# Patient Record
Sex: Male | Born: 1975 | Race: White | Hispanic: No | Marital: Single | Smoking: Current every day smoker
Health system: Southern US, Community
[De-identification: ages and names within clinical notes are randomized; demographics above are authoritative.]

## PROBLEM LIST (undated history)

## (undated) DIAGNOSIS — N189 Chronic kidney disease, unspecified: Secondary | ICD-10-CM

## (undated) DIAGNOSIS — I491 Atrial premature depolarization: Secondary | ICD-10-CM

## (undated) DIAGNOSIS — I499 Cardiac arrhythmia, unspecified: Secondary | ICD-10-CM

## (undated) DIAGNOSIS — M549 Dorsalgia, unspecified: Secondary | ICD-10-CM

## (undated) DIAGNOSIS — B009 Herpesviral infection, unspecified: Secondary | ICD-10-CM

## (undated) DIAGNOSIS — G8929 Other chronic pain: Secondary | ICD-10-CM

## (undated) DIAGNOSIS — R7989 Other specified abnormal findings of blood chemistry: Secondary | ICD-10-CM

## (undated) DIAGNOSIS — I493 Ventricular premature depolarization: Secondary | ICD-10-CM

## (undated) DIAGNOSIS — G4730 Sleep apnea, unspecified: Secondary | ICD-10-CM

## (undated) HISTORY — PX: NASAL SEPTOPLASTY W/ TURBINOPLASTY: SHX2070

---

## 2001-02-06 ENCOUNTER — Emergency Department (HOSPITAL_COMMUNITY): Admission: EM | Admit: 2001-02-06 | Discharge: 2001-02-07 | Payer: Self-pay

## 2002-01-15 ENCOUNTER — Encounter: Payer: Self-pay | Admitting: Family Medicine

## 2002-01-15 ENCOUNTER — Encounter: Admission: RE | Admit: 2002-01-15 | Discharge: 2002-01-15 | Payer: Self-pay | Admitting: Family Medicine

## 2010-07-30 HISTORY — PX: TONSILLECTOMY: SUR1361

## 2011-04-01 ENCOUNTER — Emergency Department: Payer: Self-pay | Admitting: Emergency Medicine

## 2014-11-02 ENCOUNTER — Other Ambulatory Visit (HOSPITAL_COMMUNITY): Payer: Self-pay | Admitting: Urology

## 2014-11-02 DIAGNOSIS — E291 Testicular hypofunction: Secondary | ICD-10-CM

## 2014-11-02 DIAGNOSIS — E230 Hypopituitarism: Secondary | ICD-10-CM

## 2014-11-10 ENCOUNTER — Ambulatory Visit (HOSPITAL_COMMUNITY)
Admission: RE | Admit: 2014-11-10 | Discharge: 2014-11-10 | Disposition: A | Discharge status: Home / Self Care | Payer: PRIVATE HEALTH INSURANCE | Source: Ambulatory Visit | Attending: Urology | Admitting: Urology

## 2014-11-10 DIAGNOSIS — E230 Hypopituitarism: Secondary | ICD-10-CM | POA: Diagnosis present

## 2014-11-10 DIAGNOSIS — E291 Testicular hypofunction: Secondary | ICD-10-CM | POA: Diagnosis not present

## 2014-11-10 LAB — POCT I-STAT CREATININE: Creatinine, Ser: 1.6 mg/dL — ABNORMAL HIGH (ref 0.50–1.35)

## 2014-11-10 MED ORDER — GADOBENATE DIMEGLUMINE 529 MG/ML IV SOLN
10.0000 mL | Freq: Once | INTRAVENOUS | Status: AC | PRN
Start: 2014-11-10 — End: 2014-11-10
  Administered 2014-11-10: 10 mL via INTRAVENOUS

## 2015-03-20 ENCOUNTER — Emergency Department (INDEPENDENT_AMBULATORY_CARE_PROVIDER_SITE_OTHER): Payer: PRIVATE HEALTH INSURANCE

## 2015-03-20 ENCOUNTER — Emergency Department (INDEPENDENT_AMBULATORY_CARE_PROVIDER_SITE_OTHER)
Admission: EM | Admit: 2015-03-20 | Discharge: 2015-03-20 | Disposition: A | Discharge status: Home / Self Care | Payer: PRIVATE HEALTH INSURANCE | Source: Home / Self Care | Attending: Emergency Medicine | Admitting: Emergency Medicine

## 2015-03-20 ENCOUNTER — Encounter (HOSPITAL_COMMUNITY): Payer: Self-pay | Admitting: Emergency Medicine

## 2015-03-20 DIAGNOSIS — S62309A Unspecified fracture of unspecified metacarpal bone, initial encounter for closed fracture: Secondary | ICD-10-CM

## 2015-03-20 MED ORDER — HYDROCODONE-ACETAMINOPHEN 5-325 MG PO TABS: 1.0000 | ORAL_TABLET | Freq: Four times a day (QID) | ORAL | Status: DC | PRN

## 2015-03-20 NOTE — Discharge Instructions (Signed)
You broke your third and fourth metacarpal bones. Take Tylenol as needed for pain. Use the Norco as needed for severe pain. We've the splint in place. Apply ice over the splint. Please call Dr. Merrilee Seashore office tomorrow morning for an appointment.

## 2015-03-20 NOTE — ED Notes (Signed)
C/o finger injury States he was playing softball when he heard pops in his fingers as he was swinging Did tape fingers together

## 2015-03-20 NOTE — ED Provider Notes (Signed)
CSN: [id number]     Arrival date & time 03/20/15  1851 History   First MD Initiated Contact with Patient 03/20/15 1903     Chief Complaint  Patient presents with  . Finger Injury   (Consider location/radiation/quality/duration/timing/severity/associated sxs/prior Treatment) HPI  He is a 39 year old man here for left hand injury. He states he was playing softball this afternoon and swinging the bat when the ball of the bat hit his left lateral hand. He felt a pop. He states it has gradually been getting more swollen. He is unable to make a fist due to pain and swelling. He can fully extend his fingers.  History reviewed. No pertinent past medical history. No past surgical history on file. History reviewed. No pertinent family history. Social History  Substance Use Topics  . Smoking status: None  . Smokeless tobacco: None  . Alcohol Use: None    Review of Systems As in history of present illness Allergies  Zithromax  Home Medications   Prior to Admission medications   Medication Sig Start Date End Date Taking? Authorizing Provider  HYDROcodone-acetaminophen (NORCO) 5-325 MG per tablet Take 1 tablet by mouth every 6 (six) hours as needed for moderate pain. 03/20/15   Charm Rings, MD   BP 158/79 mmHg  Pulse 86  Temp(Src) 98.6 F (37 C) (Oral)  Resp 18  SpO2 97% Physical Exam  Constitutional: He is oriented to person, place, and time. He appears well-developed and well-nourished. No distress.  Cardiovascular: Normal rate.   Pulmonary/Chest: Effort normal.  Musculoskeletal:  Left hand: Mild swelling over ulnar hand. He is tender to palpation over the third and fourth metacarpals. Limited flexion of the third fourth and fifth digits due to pain and swelling. 2+ radial pulse. Brisk cap refill in all digits.  Neurological: He is alert and oriented to person, place, and time.    ED Course  Procedures (including critical care time) Labs Review Labs Reviewed - No data to  display  Imaging Review Dg Hand Complete Left  03/20/2015   CLINICAL DATA:  Injury to the palm of the hand while batting. Pain right fourth metacarpal with soft tissue swelling.  EXAM: LEFT HAND - COMPLETE 3+ VIEW  COMPARISON:  None.  FINDINGS: Oblique fracture of the proximal/ mid shaft of the left fourth metacarpal bone. Mild dorsal displacement of the distal fracture fragment. Nondisplaced oblique fracture of the midshaft left third metacarpal bone. Soft tissue swelling. No focal bone lesions.  IMPRESSION: Acute posttraumatic fractures of the left third and fourth metacarpal bones.   Electronically Signed   By: Burman Nieves M.D.   On: 03/20/2015 19:52     MDM   1. Metacarpal bone fracture, closed, initial encounter    Spoke with Dr. Merlyn Lot, hand specialist, who recommended ulnar gutter splint and follow-up in their office this week. Ulnar gutter splint placed. Prescription for Norco given to use for pain. Follow-up with Dr. Merlyn Lot.    Charm Rings, MD 03/20/15 2003

## 2015-03-23 ENCOUNTER — Other Ambulatory Visit: Payer: Self-pay | Admitting: Orthopedic Surgery

## 2015-03-24 ENCOUNTER — Encounter (HOSPITAL_BASED_OUTPATIENT_CLINIC_OR_DEPARTMENT_OTHER): Payer: Self-pay | Admitting: Certified Registered"

## 2015-03-24 ENCOUNTER — Encounter (HOSPITAL_BASED_OUTPATIENT_CLINIC_OR_DEPARTMENT_OTHER)
Admission: RE | Disposition: A | Discharge status: Home / Self Care | Payer: Self-pay | Source: Ambulatory Visit | Attending: Orthopedic Surgery

## 2015-03-24 ENCOUNTER — Ambulatory Visit (HOSPITAL_BASED_OUTPATIENT_CLINIC_OR_DEPARTMENT_OTHER): Payer: PRIVATE HEALTH INSURANCE | Admitting: Anesthesiology

## 2015-03-24 ENCOUNTER — Ambulatory Visit (HOSPITAL_BASED_OUTPATIENT_CLINIC_OR_DEPARTMENT_OTHER)
Admission: RE | Admit: 2015-03-24 | Discharge: 2015-03-24 | Disposition: A | Discharge status: Home / Self Care | Payer: PRIVATE HEALTH INSURANCE | Source: Ambulatory Visit | Attending: Orthopedic Surgery | Admitting: Orthopedic Surgery

## 2015-03-24 DIAGNOSIS — I491 Atrial premature depolarization: Secondary | ICD-10-CM | POA: Diagnosis not present

## 2015-03-24 DIAGNOSIS — B009 Herpesviral infection, unspecified: Secondary | ICD-10-CM | POA: Insufficient documentation

## 2015-03-24 DIAGNOSIS — X58XXXA Exposure to other specified factors, initial encounter: Secondary | ICD-10-CM | POA: Insufficient documentation

## 2015-03-24 DIAGNOSIS — Y998 Other external cause status: Secondary | ICD-10-CM | POA: Diagnosis not present

## 2015-03-24 DIAGNOSIS — Y9364 Activity, baseball: Secondary | ICD-10-CM | POA: Diagnosis not present

## 2015-03-24 DIAGNOSIS — F17210 Nicotine dependence, cigarettes, uncomplicated: Secondary | ICD-10-CM | POA: Diagnosis not present

## 2015-03-24 DIAGNOSIS — Z881 Allergy status to other antibiotic agents status: Secondary | ICD-10-CM | POA: Insufficient documentation

## 2015-03-24 DIAGNOSIS — I493 Ventricular premature depolarization: Secondary | ICD-10-CM | POA: Insufficient documentation

## 2015-03-24 DIAGNOSIS — S62393A Other fracture of third metacarpal bone, left hand, initial encounter for closed fracture: Secondary | ICD-10-CM | POA: Insufficient documentation

## 2015-03-24 DIAGNOSIS — Y92320 Baseball field as the place of occurrence of the external cause: Secondary | ICD-10-CM | POA: Diagnosis not present

## 2015-03-24 DIAGNOSIS — S62395A Other fracture of fourth metacarpal bone, left hand, initial encounter for closed fracture: Secondary | ICD-10-CM | POA: Insufficient documentation

## 2015-03-24 DIAGNOSIS — M549 Dorsalgia, unspecified: Secondary | ICD-10-CM | POA: Insufficient documentation

## 2015-03-24 DIAGNOSIS — G8929 Other chronic pain: Secondary | ICD-10-CM | POA: Insufficient documentation

## 2015-03-24 HISTORY — DX: Other chronic pain: G89.29

## 2015-03-24 HISTORY — DX: Atrial premature depolarization: I49.1

## 2015-03-24 HISTORY — DX: Herpesviral infection, unspecified: B00.9

## 2015-03-24 HISTORY — PX: OPEN REDUCTION INTERNAL FIXATION (ORIF) METACARPAL: SHX6234

## 2015-03-24 HISTORY — DX: Dorsalgia, unspecified: M54.9

## 2015-03-24 HISTORY — DX: Ventricular premature depolarization: I49.3

## 2015-03-24 HISTORY — DX: Other specified abnormal findings of blood chemistry: R79.89

## 2015-03-24 LAB — POCT HEMOGLOBIN-HEMACUE: Hemoglobin: 16.2 g/dL (ref 13.0–17.0)

## 2015-03-24 SURGERY — Surgical Case
Anesthesia: *Unknown

## 2015-03-24 SURGERY — OPEN REDUCTION INTERNAL FIXATION (ORIF) METACARPAL
Anesthesia: General | Site: Hand | Laterality: Left

## 2015-03-24 MED ORDER — CEFAZOLIN SODIUM-DEXTROSE 2-3 GM-% IV SOLR
2.0000 g | INTRAVENOUS | Status: AC
  Administered 2015-03-24: 2 g via INTRAVENOUS

## 2015-03-24 MED ORDER — SCOPOLAMINE 1 MG/3DAYS TD PT72: 1.0000 | MEDICATED_PATCH | Freq: Once | TRANSDERMAL | Status: DC | PRN

## 2015-03-24 MED ORDER — ONDANSETRON HCL 4 MG/2ML IJ SOLN
INTRAMUSCULAR | Status: DC | PRN
  Administered 2015-03-24: 4 mg via INTRAVENOUS

## 2015-03-24 MED ORDER — FENTANYL CITRATE (PF) 100 MCG/2ML IJ SOLN
INTRAMUSCULAR | Status: AC
  Filled 2015-03-24: qty 6

## 2015-03-24 MED ORDER — PROPOFOL 10 MG/ML IV BOLUS
INTRAVENOUS | Status: DC | PRN
  Administered 2015-03-24: 200 mg via INTRAVENOUS

## 2015-03-24 MED ORDER — ONDANSETRON HCL 4 MG/2ML IJ SOLN: 4.0000 mg | Freq: Once | INTRAMUSCULAR | Status: DC | PRN

## 2015-03-24 MED ORDER — OXYCODONE HCL 5 MG PO TABS
ORAL_TABLET | ORAL | Status: AC
  Filled 2015-03-24: qty 1

## 2015-03-24 MED ORDER — 0.9 % SODIUM CHLORIDE (POUR BTL) OPTIME
TOPICAL | Status: DC | PRN
  Administered 2015-03-24: 250 mL

## 2015-03-24 MED ORDER — CHLORHEXIDINE GLUCONATE 4 % EX LIQD: 60.0000 mL | Freq: Once | CUTANEOUS | Status: DC

## 2015-03-24 MED ORDER — MIDAZOLAM HCL 2 MG/2ML IJ SOLN
1.0000 mg | INTRAMUSCULAR | Status: DC | PRN
  Administered 2015-03-24: 2 mg via INTRAVENOUS

## 2015-03-24 MED ORDER — BUPIVACAINE HCL (PF) 0.25 % IJ SOLN
INTRAMUSCULAR | Status: AC
  Filled 2015-03-24: qty 30

## 2015-03-24 MED ORDER — FENTANYL CITRATE (PF) 100 MCG/2ML IJ SOLN
INTRAMUSCULAR | Status: AC
  Filled 2015-03-24: qty 2

## 2015-03-24 MED ORDER — OXYCODONE-ACETAMINOPHEN 5-325 MG PO TABS: ORAL_TABLET | ORAL | Status: DC

## 2015-03-24 MED ORDER — GLYCOPYRROLATE 0.2 MG/ML IJ SOLN: 0.2000 mg | Freq: Once | INTRAMUSCULAR | Status: DC | PRN

## 2015-03-24 MED ORDER — OXYCODONE HCL 5 MG PO TABS
5.0000 mg | ORAL_TABLET | Freq: Once | ORAL | Status: AC
Start: 2015-03-24 — End: 2015-03-24
  Administered 2015-03-24: 5 mg via ORAL

## 2015-03-24 MED ORDER — BUPIVACAINE HCL (PF) 0.25 % IJ SOLN
INTRAMUSCULAR | Status: DC | PRN
  Administered 2015-03-24: 10 mL

## 2015-03-24 MED ORDER — DEXAMETHASONE SODIUM PHOSPHATE 10 MG/ML IJ SOLN
INTRAMUSCULAR | Status: DC | PRN
  Administered 2015-03-24: 10 mg via INTRAVENOUS

## 2015-03-24 MED ORDER — FENTANYL CITRATE (PF) 100 MCG/2ML IJ SOLN
25.0000 ug | INTRAMUSCULAR | Status: DC | PRN
  Administered 2015-03-24 (×2): 50 ug via INTRAVENOUS

## 2015-03-24 MED ORDER — FENTANYL CITRATE (PF) 100 MCG/2ML IJ SOLN
50.0000 ug | INTRAMUSCULAR | Status: AC | PRN
  Administered 2015-03-24: 50 ug via INTRAVENOUS
  Administered 2015-03-24: 25 ug via INTRAVENOUS
  Administered 2015-03-24: 50 ug via INTRAVENOUS

## 2015-03-24 MED ORDER — CEFAZOLIN SODIUM-DEXTROSE 2-3 GM-% IV SOLR
INTRAVENOUS | Status: AC
  Filled 2015-03-24: qty 50

## 2015-03-24 MED ORDER — MIDAZOLAM HCL 2 MG/2ML IJ SOLN
INTRAMUSCULAR | Status: AC
  Filled 2015-03-24: qty 2

## 2015-03-24 MED ORDER — LIDOCAINE HCL (CARDIAC) 20 MG/ML IV SOLN
INTRAVENOUS | Status: DC | PRN
  Administered 2015-03-24: 60 mg via INTRAVENOUS

## 2015-03-24 MED ORDER — LACTATED RINGERS IV SOLN
INTRAVENOUS | Status: DC
  Administered 2015-03-24 (×2): via INTRAVENOUS

## 2015-03-24 SURGICAL SUPPLY — 63 items
BANDAGE ELASTIC 3 VELCRO ST LF (GAUZE/BANDAGES/DRESSINGS) IMPLANT
BIT DRILL 1.1 (BIT) ×1
BIT DRILL 60X20X1.1XQC TMX (BIT) ×1 IMPLANT
BIT DRL 60X20X1.1XQC TMX (BIT) ×1
BLADE MINI RND TIP GREEN BEAV (BLADE) IMPLANT
BLADE SURG 15 STRL LF DISP TIS (BLADE) ×2 IMPLANT
BLADE SURG 15 STRL SS (BLADE) ×2
BNDG ESMARK 4X9 LF (GAUZE/BANDAGES/DRESSINGS) ×2 IMPLANT
BNDG GAUZE ELAST 4 BULKY (GAUZE/BANDAGES/DRESSINGS) ×2 IMPLANT
CHLORAPREP W/TINT 26ML (MISCELLANEOUS) ×2 IMPLANT
CORDS BIPOLAR (ELECTRODE) ×2 IMPLANT
COVER BACK TABLE 60X90IN (DRAPES) ×2 IMPLANT
COVER MAYO STAND STRL (DRAPES) ×2 IMPLANT
CUFF TOURNIQUET SINGLE 18IN (TOURNIQUET CUFF) ×2 IMPLANT
DRAPE EXTREMITY T 121X128X90 (DRAPE) ×2 IMPLANT
DRAPE OEC MINIVIEW 54X84 (DRAPES) ×2 IMPLANT
DRAPE SURG 17X23 STRL (DRAPES) ×2 IMPLANT
GAUZE SPONGE 4X4 12PLY STRL (GAUZE/BANDAGES/DRESSINGS) ×2 IMPLANT
GAUZE XEROFORM 1X8 LF (GAUZE/BANDAGES/DRESSINGS) ×2 IMPLANT
GLOVE BIO SURGEON STRL SZ7.5 (GLOVE) ×2 IMPLANT
GLOVE BIOGEL M STRL SZ7.5 (GLOVE) ×2 IMPLANT
GLOVE BIOGEL PI IND STRL 7.0 (GLOVE) ×1 IMPLANT
GLOVE BIOGEL PI IND STRL 8 (GLOVE) ×2 IMPLANT
GLOVE BIOGEL PI INDICATOR 7.0 (GLOVE) ×1
GLOVE BIOGEL PI INDICATOR 8 (GLOVE) ×2
GLOVE ECLIPSE 6.5 STRL STRAW (GLOVE) ×2 IMPLANT
GLOVE EXAM NITRILE LRG STRL (GLOVE) ×2 IMPLANT
GLOVE EXAM NITRILE MD LF STRL (GLOVE) ×2 IMPLANT
GOWN STRL REUS W/ TWL LRG LVL3 (GOWN DISPOSABLE) ×1 IMPLANT
GOWN STRL REUS W/ TWL XL LVL3 (GOWN DISPOSABLE) ×2 IMPLANT
GOWN STRL REUS W/TWL LRG LVL3 (GOWN DISPOSABLE) ×1
GOWN STRL REUS W/TWL XL LVL3 (GOWN DISPOSABLE) ×2
NEEDLE HYPO 22GX1.5 SAFETY (NEEDLE) IMPLANT
NEEDLE HYPO 25X1 1.5 SAFETY (NEEDLE) ×2 IMPLANT
NS IRRIG 1000ML POUR BTL (IV SOLUTION) ×2 IMPLANT
PACK BASIN DAY SURGERY FS (CUSTOM PROCEDURE TRAY) ×2 IMPLANT
PAD CAST 3X4 CTTN HI CHSV (CAST SUPPLIES) IMPLANT
PAD CAST 4YDX4 CTTN HI CHSV (CAST SUPPLIES) ×1 IMPLANT
PADDING CAST ABS 4INX4YD NS (CAST SUPPLIES) ×1
PADDING CAST ABS COTTON 4X4 ST (CAST SUPPLIES) ×1 IMPLANT
PADDING CAST COTTON 3X4 STRL (CAST SUPPLIES)
PADDING CAST COTTON 4X4 STRL (CAST SUPPLIES) ×1
SCREW NL 1.5X11 WRIST (Screw) ×2 IMPLANT
SCREW NL 1.5X12 (Screw) ×4 IMPLANT
SCREW NONIOC 1.5 10M (Screw) ×2 IMPLANT
SCREW NONIOC 1.5 14M (Screw) ×6 IMPLANT
SLEEVE SCD COMPRESS KNEE MED (MISCELLANEOUS) ×2 IMPLANT
SPLINT PLASTER CAST XFAST 3X15 (CAST SUPPLIES) IMPLANT
SPLINT PLASTER CAST XFAST 4X15 (CAST SUPPLIES) ×20 IMPLANT
SPLINT PLASTER XTRA FAST SET 4 (CAST SUPPLIES) ×20
SPLINT PLASTER XTRA FASTSET 3X (CAST SUPPLIES)
STOCKINETTE 4X48 STRL (DRAPES) ×2 IMPLANT
SUT CHROMIC 5 0 P 3 (SUTURE) ×2 IMPLANT
SUT ETHILON 3 0 PS 1 (SUTURE) IMPLANT
SUT ETHILON 4 0 PS 2 18 (SUTURE) ×2 IMPLANT
SUT MERSILENE 4 0 P 3 (SUTURE) IMPLANT
SUT VIC AB 3-0 PS1 18 (SUTURE)
SUT VIC AB 3-0 PS1 18XBRD (SUTURE) IMPLANT
SUT VICRYL 4-0 PS2 18IN ABS (SUTURE) ×2 IMPLANT
SYR BULB 3OZ (MISCELLANEOUS) ×2 IMPLANT
SYR CONTROL 10ML LL (SYRINGE) ×2 IMPLANT
TOWEL OR 17X24 6PK STRL BLUE (TOWEL DISPOSABLE) ×4 IMPLANT
UNDERPAD 30X30 (UNDERPADS AND DIAPERS) ×2 IMPLANT

## 2015-03-24 NOTE — Transfer of Care (Signed)
Immediate Anesthesia Transfer of Care Note  Patient: Steven Bauer  Procedure(s) Performed: Procedure(s): OPEN REDUCTION INTERNAL FIXATION (ORIF) LONG AND RING METACARPAL FRACTURE (Left)  Patient Location: PACU  Anesthesia Type:General  Level of Consciousness: awake and patient cooperative  Airway & Oxygen Therapy: Patient Spontanous Breathing and Patient connected to face mask oxygen  Post-op Assessment: Report given to RN and Post -op Vital signs reviewed and stable  Post vital signs: Reviewed and stable  Last Vitals:  Filed Vitals:   03/24/15 0759  BP: 157/52  Pulse: 70  Temp: 36.6 C  Resp: 18    Complications: No apparent anesthesia complications

## 2015-03-24 NOTE — Discharge Instructions (Addendum)

## 2015-03-24 NOTE — Op Note (Signed)
[id number] 

## 2015-03-24 NOTE — Brief Op Note (Signed)
03/24/2015  11:12 AM  PATIENT:  Lajean Manes  39 y.o. male  PRE-OPERATIVE DIAGNOSIS:  Fracture left long/ring finger metacarpals  POST-OPERATIVE DIAGNOSIS:  Fracture left long/ring finger metacarpals  PROCEDURE:  Procedure(s): OPEN REDUCTION INTERNAL FIXATION (ORIF) LONG AND RING METACARPAL FRACTURE (Left)  SURGEON:  Surgeon(s) and Role:    * Betha Loa, MD - Primary  PHYSICIAN ASSISTANT:   ASSISTANTS: Annye Rusk, PA   ANESTHESIA:   general  EBL:  Total I/O In: 1300 [I.V.:1300] Out: -   BLOOD ADMINISTERED:none  DRAINS: none   LOCAL MEDICATIONS USED:  MARCAINE     SPECIMEN:  No Specimen  DISPOSITION OF SPECIMEN:  N/A  COUNTS:  YES  TOURNIQUET:   Total Tourniquet Time Documented: Upper Arm (Left) - 68 minutes Total: Upper Arm (Left) - 68 minutes   DICTATION: .Other Dictation: Dictation Number [id number]  PLAN OF CARE: Discharge to home after PACU  PATIENT DISPOSITION:  PACU - hemodynamically stable.

## 2015-03-24 NOTE — H&P (Signed)
  Steven Bauer is an 39 y.o. male.   Chief Complaint: left long and ring finger metacarpal fractures HPI: 39 yo rhd male injured left hand playing softball 03/20/15.  Seen at [facility] where XR revealed ring and long finger metacarpal fractures.  Splinted and followed up in office.  Reports no previous injury to hand and no other injury at this time.  Past Medical History  Diagnosis Date  . PVC (premature ventricular contraction)   . PAC (premature atrial contraction)   . HSV (herpes simplex virus) infection   . Chronic back pain   . Low testosterone     Past Surgical History  Procedure Laterality Date  . Tonsillectomy  2012    History reviewed. No pertinent family history. Social History:  reports that he has been smoking Cigarettes.  He has been smoking about 0.50 packs per day. He does not have any smokeless tobacco history on file. He reports that he does not drink alcohol or use illicit drugs.  Allergies:  Allergies  Allergen Reactions  . Zithromax [Azithromycin] Rash    Medications Prior to Admission  Medication Sig Dispense Refill  . CLOMIPHENE CITRATE PO Take by mouth.    Marland Kitchen HYDROcodone-acetaminophen (NORCO) 5-325 MG per tablet Take 1 tablet by mouth every 6 (six) hours as needed for moderate pain. 15 tablet 0  . traMADol (ULTRAM) 50 MG tablet Take by mouth every 6 (six) hours as needed.    . valACYclovir (VALTREX) 1000 MG tablet Take 1,000 mg by mouth 2 (two) times daily.    . cyclobenzaprine (FLEXERIL) 10 MG tablet Take 10 mg by mouth 3 (three) times daily as needed for muscle spasms.      Results for orders placed or performed during the hospital encounter of 03/24/15 (from the past 48 hour(s))  Hemoglobin-hemacue, POC     Status: None   Collection Time: 03/24/15  8:19 AM  Result Value Ref Range   Hemoglobin 16.2 13.0 - 17.0 g/dL    No results found.   A comprehensive review of systems was negative except for: Eyes: positive for contacts/glasses Ears, nose, mouth,  throat, and face: positive for hearing loss Hematologic/lymphatic: positive for easy bruising Neurological: positive for gait problems Behavioral/Psych: positive for sleep disturbance  Blood pressure 157/52, pulse 70, temperature 97.9 F (36.6 C), temperature source Oral, resp. rate 18, height 6' (1.829 m), weight 92.194 kg (203 lb 4 oz), SpO2 100 %.  General appearance: alert, cooperative and appears stated age Head: Normocephalic, without obvious abnormality, atraumatic Neck: supple, symmetrical, trachea midline Resp: clear to auscultation bilaterally Cardio: regular rate and rhythm GI: non tender Extremities: intact sensation and capillary refill all digits.  +epl/fpl/io.  no wounds. Pulses: 2+ and symmetric Skin: Skin color, texture, turgor normal. No rashes or lesions Neurologic: Grossly normal Incision/Wound: none  Assessment/Plan Left long and ring finger metacarpal fractures.  Non operative and operative treatment options were discussed with the patient and patient wishes to proceed with operative treatment. Risks, benefits, and alternatives of surgery were discussed and the patient agrees with the plan of care.   Celesta Funderburk R 03/24/2015, 9:38 AM

## 2015-03-24 NOTE — Anesthesia Postprocedure Evaluation (Signed)
  Anesthesia Post-op Note  Patient: Steven Bauer  Procedure(s) Performed: Procedure(s) (LRB): OPEN REDUCTION INTERNAL FIXATION (ORIF) LONG AND RING METACARPAL FRACTURE (Left)  Patient Location: PACU  Anesthesia Type: General  Level of Consciousness: awake and alert   Airway and Oxygen Therapy: Patient Spontanous Breathing  Post-op Pain: mild  Post-op Assessment: Post-op Vital signs reviewed, Patient's Cardiovascular Status Stable, Respiratory Function Stable, Patent Airway and No signs of Nausea or vomiting  Last Vitals:  Filed Vitals:   03/24/15 1225  BP: 140/80  Pulse: 72  Temp: 36.6 C  Resp: 20    Post-op Vital Signs: stable   Complications: No apparent anesthesia complications

## 2015-03-24 NOTE — Anesthesia Preprocedure Evaluation (Addendum)
Anesthesia Evaluation  Patient identified by MRN, date of birth, ID band Patient awake    Reviewed: Allergy & Precautions, NPO status , Patient's Chart, lab work & pertinent test results  History of Anesthesia Complications Negative for: history of anesthetic complications  Airway Mallampati: II  TM Distance: >3 FB Neck ROM: Full    Dental no notable dental hx. (+) Dental Advisory Given   Pulmonary neg pulmonary ROS, Current Smoker,  breath sounds clear to auscultation  Pulmonary exam normal       Cardiovascular negative cardio ROS Normal cardiovascular examRhythm:Regular Rate:Normal     Neuro/Psych negative neurological ROS  negative psych ROS   GI/Hepatic negative GI ROS, Neg liver ROS,   Endo/Other  negative endocrine ROS  Renal/GU negative Renal ROS  negative genitourinary   Musculoskeletal negative musculoskeletal ROS (+)   Abdominal   Peds negative pediatric ROS (+)  Hematology negative hematology ROS (+)   Anesthesia Other Findings   Reproductive/Obstetrics negative OB ROS                            Anesthesia Physical Anesthesia Plan  ASA: I  Anesthesia Plan: General   Post-op Pain Management:    Induction: Intravenous  Airway Management Planned: LMA  Additional Equipment:   Intra-op Plan:   Post-operative Plan: Extubation in OR  Informed Consent: I have reviewed the patients History and Physical, chart, labs and discussed the procedure including the risks, benefits and alternatives for the proposed anesthesia with the patient or authorized representative who has indicated his/her understanding and acceptance.   Dental advisory given  Plan Discussed with: CRNA  Anesthesia Plan Comments:         Anesthesia Quick Evaluation

## 2015-03-24 NOTE — Op Note (Signed)
Intra-operative fluoroscopic images in the AP, lateral, and oblique views were taken and evaluated by myself.  Reduction and hardware placement were confirmed.  There was no intraarticular penetration of permanent hardware.  

## 2015-03-24 NOTE — Anesthesia Procedure Notes (Signed)
Procedure Name: LMA Insertion Date/Time: 03/24/2015 9:52 AM Performed by: Akash Winski D Pre-anesthesia Checklist: Patient identified, Emergency Drugs available, Suction available and Patient being monitored Patient Re-evaluated:Patient Re-evaluated prior to inductionOxygen Delivery Method: Circle System Utilized Preoxygenation: Pre-oxygenation with 100% oxygen Intubation Type: IV induction Ventilation: Mask ventilation without difficulty LMA: LMA inserted LMA Size: 4.0 Number of attempts: 1 Airway Equipment and Method: Bite block Placement Confirmation: positive ETCO2 Tube secured with: Tape Dental Injury: Teeth and Oropharynx as per pre-operative assessment

## 2015-03-25 ENCOUNTER — Encounter (HOSPITAL_BASED_OUTPATIENT_CLINIC_OR_DEPARTMENT_OTHER): Payer: Self-pay | Admitting: Orthopedic Surgery

## 2015-03-25 NOTE — Op Note (Signed)
NAMETAKEO, HARTS NO.:  [id number]  MEDICAL RECORD NO.:  [id number]  LOCATION:                                 FACILITY:  PHYSICIAN:  Betha Loa, MD        DATE OF BIRTH:  Mar 12, 1976  DATE OF PROCEDURE:  03/24/2015 DATE OF DISCHARGE:                              OPERATIVE REPORT   PREOPERATIVE DIAGNOSIS:  Left long and ring finger metacarpal fractures.  POSTOPERATIVE DIAGNOSIS:  Left long and ring finger metacarpal fractures.  PROCEDURE:   1. Open reduction and internal fixation left long finger metacarpal fracture. 2. Open reduction and internal fixation left ring finger metacarpal fracture.  SURGEON:  Betha Loa, MD  ASSISTANT:  Marveen Reeks Dasnoit, PA.  ANESTHESIA:  General.  IV FLUIDS:  Per anesthesia flow sheet.  ESTIMATED BLOOD LOSS:  Minimal.  COMPLICATIONS:  None.  SPECIMENS:  None.  TOURNIQUET TIME:  68 minutes.  DISPOSITION:  Stable to PACU.  INDICATIONS:  Steven Bauer is a 39 year old, right-hand dominant male who approximately 5 days ago injured his left hand while playing softball. He was taken to an urgent care where radiographs were taken revealing long and ring finger metacarpal fractures.  He was referred to me for further care after splinting.  We discussed nonoperative and operative treatment options.  He wished to proceed with operative care.  Risks, benefits, and alternatives of surgery were discussed including risk of blood loss; infection; damage to nerves, vessels, tendons, ligaments, bone; failure of surgery; need for additional surgery; complications with wound healing, continued pain, nonunion, malunion, stiffness.  He voiced understanding of these risks and elected to proceed.  OPERATIVE COURSE:  After being identified preoperatively by myself, the patient and I agreed upon procedure and site of procedure.  Surgical site was marked.  The risks, benefits, and alternatives of surgery were reviewed and he wished to  proceed.  Surgical consent had been signed. He was given IV Ancef as preoperative antibiotic prophylaxis.  He was transported to the operating room and placed on the operating room table in a supine position with the left upper extremity on arm board. General anesthesia was induced by the anesthesiologist.  The left upper extremity was prepped and draped in normal sterile orthopedic fashion. Surgical pause was performed between surgeons, anesthesia, and operating room staff, and all were in agreement as to the patient, procedure, and site of procedure.  Tourniquet at the proximal aspect of the extremity was inflated to 250 mmHg after exsanguination of the limb with an Esmarch bandage.  An incision was made at the dorsum of the hand between the long and ring finger metacarpals, securing subcutaneous tissues by spreading technique.  Bipolar electrocautery was used to obtain hemostasis.  The fascia was incised.  There was accessory extensor muscle going to the long finger.  The juncture between the long and ring finger extensor tendons was incised and noted for repair later.  The periosteum over the long finger was incised and elevated with periosteal elevator.  The fracture was identified towards the distal end of the metacarpal.  It was reduced under direct visualization.  Standard AO drilling and measuring technique was used.  The ALPS set was used.  1.5- mm screws were selected.  Three lag screws were placed across the fracture site to provide stabilization of the fracture.  C-arm was used in AP and lateral projections for appropriate reduction and position of hardware, which was the case.  Attention was turned to the ring finger metacarpal.  An incision was made over the ring finger metacarpal through the periosteum.  This periosteum was elevated with a Therapist, nutritional.  The fracture site was identified.  It was cleared of clot formation.  It was reduced under direct visualization and  provisionally held with a clamp.  Again standard AO drilling and measuring technique was used to place four 1.5-mm screws from the ALPS set across the fracture site.  This stabilized the fracture.  C-arm was used in AP, lateral, and oblique projections to ensure appropriate reduction and position of the hardware, which was the case.  The wounds were copiously irrigated with sterile saline.  The periosteum was closed over top of the metacarpals with 5-0 chromic suture.  The release juncture was repaired with a 4-0 Mersilene in a figure-of-eight fashion.  The subcutaneous tissue was closed with inverted interrupted Vicryl sutures. Skin was closed with 4-0 nylon in a horizontal mattress fashion.  The area was injected with 10 mL of 0.25% plain Marcaine to aid in postoperative analgesia.  It was then dressed with sterile Xeroform, 4 x 4s, and wrapped with Kerlix.  A volar and dorsal slab splint including the index, long, ring, and small fingers was placed with the MPs flexed and IPs extended.  This was wrapped with Kerlix and Ace bandage. Tourniquet was deflated at 68 minutes.  Fingertips were pink with brisk capillary refill after deflation of the tourniquet.  The operative drapes were broken down and the patient was awoken from anesthesia safely.  He was transferred back to the stretcher and taken to PACU in a stable condition.  I will see him back in the office in 1 week for postoperative followup.  I will give him Percocet 5/325, one to two p.o. q.6 hours p.r.n. pain, dispensed #40.     Betha Loa, MD     KK/MEDQ  D:  03/24/2015  T:  03/25/2015  Job:  [id number]

## 2015-06-10 ENCOUNTER — Telehealth: Payer: Self-pay | Admitting: Family Medicine

## 2015-06-10 NOTE — Telephone Encounter (Signed)
pts roommate, Steven Bauer is a patient of yours and has recommended you to him. Can you take him on as a new patient? Please advise

## 2015-06-11 NOTE — Telephone Encounter (Signed)
yes

## 2015-06-13 NOTE — Telephone Encounter (Signed)
lmovm to call back to schedule an appt °

## 2015-06-21 ENCOUNTER — Telehealth: Payer: Self-pay | Admitting: Internal Medicine

## 2015-06-21 ENCOUNTER — Ambulatory Visit (INDEPENDENT_AMBULATORY_CARE_PROVIDER_SITE_OTHER): Payer: PRIVATE HEALTH INSURANCE | Admitting: Internal Medicine

## 2015-06-21 ENCOUNTER — Other Ambulatory Visit (INDEPENDENT_AMBULATORY_CARE_PROVIDER_SITE_OTHER): Payer: PRIVATE HEALTH INSURANCE

## 2015-06-21 ENCOUNTER — Encounter: Payer: Self-pay | Admitting: Internal Medicine

## 2015-06-21 VITALS — BP 118/64 | HR 77 | Temp 97.8°F | Resp 16 | Ht 72.0 in | Wt 212.0 lb

## 2015-06-21 DIAGNOSIS — A6001 Herpesviral infection of penis: Secondary | ICD-10-CM | POA: Diagnosis not present

## 2015-06-21 DIAGNOSIS — F418 Other specified anxiety disorders: Secondary | ICD-10-CM

## 2015-06-21 DIAGNOSIS — N183 Chronic kidney disease, stage 3 unspecified: Secondary | ICD-10-CM

## 2015-06-21 DIAGNOSIS — M545 Low back pain, unspecified: Secondary | ICD-10-CM | POA: Insufficient documentation

## 2015-06-21 DIAGNOSIS — Z299 Encounter for prophylactic measures, unspecified: Secondary | ICD-10-CM | POA: Insufficient documentation

## 2015-06-21 DIAGNOSIS — Z7251 High risk heterosexual behavior: Secondary | ICD-10-CM

## 2015-06-21 DIAGNOSIS — Z0000 Encounter for general adult medical examination without abnormal findings: Secondary | ICD-10-CM

## 2015-06-21 DIAGNOSIS — M549 Dorsalgia, unspecified: Secondary | ICD-10-CM | POA: Diagnosis not present

## 2015-06-21 LAB — COMPREHENSIVE METABOLIC PANEL
ALT: 22 U/L (ref 0–53)
AST: 24 U/L (ref 0–37)
Albumin: 4.3 g/dL (ref 3.5–5.2)
Alkaline Phosphatase: 73 U/L (ref 39–117)
BUN: 19 mg/dL (ref 6–23)
CHLORIDE: 101 meq/L (ref 96–112)
CO2: 30 mEq/L (ref 19–32)
CREATININE: 1.53 mg/dL — AB (ref 0.40–1.50)
Calcium: 9.6 mg/dL (ref 8.4–10.5)
GFR: 54.47 mL/min — ABNORMAL LOW (ref >60.00)
GLUCOSE: 91 mg/dL (ref 70–99)
POTASSIUM: 4.3 meq/L (ref 3.5–5.1)
SODIUM: 137 meq/L (ref 135–145)
Total Bilirubin: 0.5 mg/dL (ref 0.2–1.2)
Total Protein: 7.1 g/dL (ref 6.0–8.3)

## 2015-06-21 LAB — URINALYSIS, ROUTINE W REFLEX MICROSCOPIC
Bilirubin Urine: NEGATIVE
HGB URINE DIPSTICK: NEGATIVE
Ketones, ur: NEGATIVE
Leukocytes, UA: NEGATIVE
NITRITE: NEGATIVE
PH: 7 (ref 5.0–8.0)
RBC / HPF: NONE SEEN (ref 0–?)
SPECIFIC GRAVITY, URINE: 1.02 (ref 1.000–1.030)
TOTAL PROTEIN, URINE-UPE24: NEGATIVE
Urine Glucose: NEGATIVE
Urobilinogen, UA: 0.2 (ref 0.0–1.0)

## 2015-06-21 LAB — LIPID PANEL
CHOLESTEROL: 177 mg/dL (ref 0–200)
HDL: 33.3 mg/dL — ABNORMAL LOW (ref >39.00)
LDL CALC: 107 mg/dL — AB (ref 0–99)
NONHDL: 143.82
Total CHOL/HDL Ratio: 5
Triglycerides: 185 mg/dL — ABNORMAL HIGH (ref 0.0–149.0)
VLDL: 37 mg/dL (ref 0.0–40.0)

## 2015-06-21 LAB — TSH: TSH: 1.6 u[IU]/mL (ref 0.35–4.50)

## 2015-06-21 LAB — CBC WITH DIFFERENTIAL/PLATELET
BASOS PCT: 0.4 % (ref 0.0–3.0)
Basophils Absolute: 0 10*3/uL (ref 0.0–0.1)
EOS ABS: 0.2 10*3/uL (ref 0.0–0.7)
EOS PCT: 3.7 % (ref 0.0–5.0)
HCT: 49.4 % (ref 39.0–52.0)
Hemoglobin: 16.3 g/dL (ref 13.0–17.0)
LYMPHS ABS: 2.4 10*3/uL (ref 0.7–4.0)
Lymphocytes Relative: 37.6 % (ref 12.0–46.0)
MCHC: 33 g/dL (ref 30.0–36.0)
MCV: 89.6 fl (ref 78.0–100.0)
MONO ABS: 0.7 10*3/uL (ref 0.1–1.0)
Monocytes Relative: 10.5 % (ref 3.0–12.0)
NEUTROS ABS: 3 10*3/uL (ref 1.4–7.7)
NEUTROS PCT: 47.8 % (ref 43.0–77.0)
PLATELETS: 252 10*3/uL (ref 150.0–400.0)
RBC: 5.51 Mil/uL (ref 4.22–5.81)
RDW: 13.3 % (ref 11.5–15.5)
WBC: 6.3 10*3/uL (ref 4.0–10.5)

## 2015-06-21 LAB — RPR

## 2015-06-21 MED ORDER — ESCITALOPRAM OXALATE 10 MG PO TABS: 10.0000 mg | ORAL_TABLET | Freq: Every day | ORAL | Status: DC

## 2015-06-21 MED ORDER — EMTRICITABINE-TENOFOVIR DF 200-300 MG PO TABS: 1.0000 | ORAL_TABLET | Freq: Every day | ORAL | Status: DC

## 2015-06-21 MED ORDER — VALACYCLOVIR HCL 1 G PO TABS: 1000.0000 mg | ORAL_TABLET | Freq: Every day | ORAL | Status: DC

## 2015-06-21 MED ORDER — EPINEPHRINE 0.15 MG/0.15ML IJ SOAJ: 0.1500 mg | INTRAMUSCULAR | Status: DC | PRN

## 2015-06-21 MED ORDER — TRAMADOL HCL 50 MG PO TABS: 50.0000 mg | ORAL_TABLET | Freq: Four times a day (QID) | ORAL | Status: DC | PRN

## 2015-06-21 MED ORDER — CYCLOBENZAPRINE HCL 10 MG PO TABS: 10.0000 mg | ORAL_TABLET | Freq: Three times a day (TID) | ORAL | Status: DC | PRN

## 2015-06-21 NOTE — Progress Notes (Signed)
Pre visit review using our clinic review tool, if applicable. No additional management support is needed unless otherwise documented below in the visit note. 

## 2015-06-21 NOTE — Telephone Encounter (Signed)
[facility] called to advise that the EPINEPHrine 0.15 MG/0.15ML IJ injection [[id number]][[id number]] sent is for children. They are placing on hold to make sure that this is the correct one. Please send a new script if this in fact an error. Thank you.

## 2015-06-21 NOTE — Progress Notes (Signed)
Subjective:  Patient ID: Steven Bauer, male    DOB: [birth year]  Age: 39 y.o. MRN: [id number]  CC: Back Pain; Annual Exam; and Depression  New to me HPI Steven Bauer presents for a CPX as well as medical concerns. He complains of chronic, intermittent low back pain and requests a refill on tramadol. He is sexually active and has several sexual partners a month and he is requesting to start taking Truvada. He complains of anxiety, irritability, anger and rage and request to start on an antidepressant. He is being treated for hypogonadism and he tells me that he sees a urologist about every 6 months.  Outpatient Prescriptions Prior to Visit  Medication Sig Dispense Refill  . cyclobenzaprine (FLEXERIL) 10 MG tablet Take 10 mg by mouth 3 (three) times daily as needed for muscle spasms.    Marland Kitchen CLOMIPHENE CITRATE PO Take by mouth.    . oxyCODONE-acetaminophen (PERCOCET) 5-325 MG per tablet 1-2 tabs po q6 hours prn pain 40 tablet 0  . valACYclovir (VALTREX) 1000 MG tablet Take 1,000 mg by mouth 2 (two) times daily.     No facility-administered medications prior to visit.    ROS Review of Systems  Constitutional: Positive for fatigue. Negative for fever, chills, diaphoresis, activity change, appetite change and unexpected weight change.  HENT: Negative.   Eyes: Negative.  Negative for visual disturbance.  Respiratory: Negative.  Negative for cough, choking, chest tightness, shortness of breath and stridor.   Cardiovascular: Negative.  Negative for chest pain, palpitations and leg swelling.  Gastrointestinal: Negative.  Negative for nausea, vomiting, abdominal pain, diarrhea, constipation and blood in stool.  Endocrine: Negative.   Genitourinary: Negative.  Negative for dysuria, discharge, scrotal swelling, genital sores and testicular pain.  Musculoskeletal: Positive for back pain. Negative for myalgias, joint swelling, arthralgias, gait problem, neck pain and neck stiffness.  Skin:  Negative.  Negative for rash.  Allergic/Immunologic: Negative.   Neurological: Negative for dizziness.  Hematological: Negative.  Negative for adenopathy. Does not bruise/bleed easily.  Psychiatric/Behavioral: Positive for dysphoric mood. Negative for suicidal ideas, hallucinations, behavioral problems, confusion, sleep disturbance, self-injury, decreased concentration and agitation. The patient is nervous/anxious. The patient is not hyperactive.     Objective:  BP 118/64 mmHg  Pulse 77  Temp(Src) 97.8 F (36.6 C) (Oral)  Resp 16  Ht 6' (1.829 m)  Wt 212 lb (96.163 kg)  BMI 28.75 kg/m2  SpO2 97%  BP Readings from Last 3 Encounters:  06/21/15 118/64  03/24/15 140/80  03/20/15 158/79    Wt Readings from Last 3 Encounters:  06/21/15 212 lb (96.163 kg)  03/24/15 203 lb 4 oz (92.194 kg)  11/10/14 200 lb (90.719 kg)    Physical Exam  Constitutional: He is oriented to person, place, and time. He appears well-developed and well-nourished. No distress.  HENT:  Mouth/Throat: Oropharynx is clear and moist. No oropharyngeal exudate.  Eyes: Conjunctivae are normal. Right eye exhibits no discharge. Left eye exhibits no discharge. No scleral icterus.  Neck: Normal range of motion. Neck supple. No JVD present. No tracheal deviation present. No thyromegaly present.  Cardiovascular: Normal rate, regular rhythm, normal heart sounds and intact distal pulses.  Exam reveals no gallop and no friction rub.   No murmur heard. Pulmonary/Chest: Effort normal and breath sounds normal. No stridor. No respiratory distress. He has no wheezes. He has no rales. He exhibits no tenderness.  Abdominal: Soft. Bowel sounds are normal. He exhibits no distension and no mass. There is no  tenderness. There is no rebound and no guarding.  Musculoskeletal: Normal range of motion. He exhibits no edema or tenderness.  Lymphadenopathy:    He has no cervical adenopathy.  Neurological: He is oriented to person, place, and  time.  Skin: Skin is warm and dry. No rash noted. He is not diaphoretic. No erythema. No pallor.  Psychiatric: He has a normal mood and affect. His behavior is normal. Judgment and thought content normal.  Vitals reviewed.   Lab Results  Component Value Date   WBC 6.3 06/21/2015   HGB 16.3 06/21/2015   HCT 49.4 06/21/2015   PLT 252.0 06/21/2015   GLUCOSE 91 06/21/2015   CHOL 177 06/21/2015   TRIG 185.0* 06/21/2015   HDL 33.30* 06/21/2015   LDLCALC 107* 06/21/2015   ALT 22 06/21/2015   AST 24 06/21/2015   NA 137 06/21/2015   K 4.3 06/21/2015   CL 101 06/21/2015   CREATININE 1.53* 06/21/2015   BUN 19 06/21/2015   CO2 30 06/21/2015   TSH 1.60 06/21/2015    No results found.  Assessment & Plan:   [name] was seen today for back pain, annual exam and depression.  Diagnoses and all orders for this visit:  Routine general medical examination at a health care facility- exam done, genitourinary exam was deferred as he sees a urologist every 6 months, vaccines were reviewed and updated, labs ordered, patient education material was given. -     EPINEPHrine 0.15 MG/0.15ML IJ injection; Inject 0.15 mLs (0.15 mg total) into the muscle as needed for anaphylaxis. -     Lipid panel; Future -     Comprehensive metabolic panel; Future -     CBC with Differential/Platelet; Future -     TSH; Future -     Urinalysis, Routine w reflex microscopic ([facility]); Future -     HIV antibody; Future -     RPR; Future  Encounter for preventive measure -     emtricitabine-tenofovir (TRUVADA) 200-300 MG tablet; Take 1 tablet by mouth daily.  High risk sexual behavior- he is negative for HIV and syphilis, at his request he will start taking Truvada for pre-exposure prophylaxis -     HIV antibody; Future -     RPR; Future -     emtricitabine-tenofovir (TRUVADA) 200-300 MG tablet; Take 1 tablet by mouth daily.  Bilateral back pain, unspecified location- he has had chronic stable low back pain with  no evidence of radiculopathy. He continues to get symptom relief with tramadol and Flexeril and will therefore continue that. -     traMADol (ULTRAM) 50 MG tablet; Take 1 tablet (50 mg total) by mouth every 6 (six) hours as needed. -     cyclobenzaprine (FLEXERIL) 10 MG tablet; Take 1 tablet (10 mg total) by mouth 3 (three) times daily as needed for muscle spasms.  Type 2 HSV infection of penis -     valACYclovir (VALTREX) 1000 MG tablet; Take 1 tablet (1,000 mg total) by mouth daily.  Depression with anxiety -     escitalopram (LEXAPRO) 10 MG tablet; Take 1 tablet (10 mg total) by mouth at bedtime.  Kidney disease, chronic, stage III (GFR 30-59 ml/min)- he has a GFR of about 54, his urinalysis is normal, I did not see any obvious causes for renal dysfunction, he will avoid anti-inflammatories and other nephrotoxic agents, he will start taking Truvada and I will bring him back in 3-4 weeks to recheck his blood pressure and his  renal function.   I have discontinued Mr. Arlotta valACYclovir, CLOMIPHENE CITRATE PO, and oxyCODONE-acetaminophen. I have also changed his traMADol, cyclobenzaprine, valACYclovir, and EPINEPHrine. Additionally, I am having him start on escitalopram and emtricitabine-tenofovir. Lastly, I am having him maintain his testosterone cypionate and eszopiclone.  Meds ordered this encounter  Medications  . testosterone cypionate (DEPOTESTOSTERONE CYPIONATE) 200 MG/ML injection    Sig: INJECT 1 ML INTO THE MUSCLE EVERY 10 DAYS    Refill:  1  . DISCONTD: valACYclovir (VALTREX) 1000 MG tablet    Sig: TAKE 1 TABLET BY MOUTH EVERY DAY  . DISCONTD: traMADol (ULTRAM) 50 MG tablet    Sig: Take 50 mg by mouth.  . eszopiclone (LUNESTA) 2 MG TABS tablet    Sig: Take 2 mg by mouth at bedtime as needed for sleep. Take immediately before bedtime  . traMADol (ULTRAM) 50 MG tablet    Sig: Take 1 tablet (50 mg total) by mouth every 6 (six) hours as needed.    Dispense:  90 tablet     Refill:  5  . cyclobenzaprine (FLEXERIL) 10 MG tablet    Sig: Take 1 tablet (10 mg total) by mouth 3 (three) times daily as needed for muscle spasms.    Dispense:  75 tablet    Refill:  5  . valACYclovir (VALTREX) 1000 MG tablet    Sig: Take 1 tablet (1,000 mg total) by mouth daily.    Dispense:  90 tablet    Refill:  1  . DISCONTD: EPINEPHrine 0.15 MG/0.15ML IJ injection    Sig: Inject 0.15 mg into the muscle as needed for anaphylaxis.  Marland Kitchen EPINEPHrine 0.15 MG/0.15ML IJ injection    Sig: Inject 0.15 mLs (0.15 mg total) into the muscle as needed for anaphylaxis.    Dispense:  2 Device    Refill:  3  . escitalopram (LEXAPRO) 10 MG tablet    Sig: Take 1 tablet (10 mg total) by mouth at bedtime.    Dispense:  90 tablet    Refill:  1  . emtricitabine-tenofovir (TRUVADA) 200-300 MG tablet    Sig: Take 1 tablet by mouth daily.    Dispense:  30 tablet    Refill:  0     Follow-up: Return in about 4 weeks (around 07/19/2015).  Sanda Linger, MD

## 2015-06-21 NOTE — Patient Instructions (Signed)
Safe Sex  Safe sex is about reducing the risk of giving or getting a sexually transmitted disease (STD). STDs are spread through sexual contact involving the genitals, mouth, or rectum. Some STDs can be cured and others cannot. Safe sex can also prevent unintended pregnancies.   WHAT ARE SOME SAFE SEX PRACTICES?  · Limit your sexual activity to only one partner who is having sex with only you.  · Talk to your partner about his or her past partners, past STDs, and drug use.  · Use a condom every time you have sexual intercourse. This includes vaginal, oral, and anal sexual activity. Both females and males should wear condoms during oral sex. Only use latex or polyurethane condoms and water-based lubricants. Using petroleum-based lubricants or oils to lubricate a condom will weaken the condom and increase the chance that it will break. The condom should be in place from the beginning to the end of sexual activity. Wearing a condom reduces, but does not completely eliminate, your risk of getting or giving an STD. STDs can be spread by contact with infected body fluids and skin.  · Get vaccinated for hepatitis B and HPV.  · Avoid alcohol and recreational drugs, which can affect your judgment. You may forget to use a condom or participate in high-risk sex.  · For females, avoid douching after sexual intercourse. Douching can spread an infection farther into the reproductive tract.  · Check your body for signs of sores, blisters, rashes, or unusual discharge. See your health care provider if you notice any of these signs.  · Avoid sexual contact if you have symptoms of an infection or are being treated for an STD. If you or your partner has herpes, avoid sexual contact when blisters are present. Use condoms at all other times.  · If you are at risk of being infected with HIV, it is recommended that you take a prescription medicine daily to prevent HIV infection. This is called pre-exposure prophylaxis (PrEP). You are  considered at risk if:    You are a man who has sex with other men (MSM).    You are a heterosexual man or woman who is sexually active with more than one partner.    You take drugs by injection.    You are sexually active with a partner who has HIV.  · Talk with your health care provider about whether you are at high risk of being infected with HIV. If you choose to begin PrEP, you should first be tested for HIV. You should then be tested every 3 months for as long as you are taking PrEP.  · See your health care provider for regular screenings, exams, and tests for other STDs. Before having sex with a new partner, each of you should be screened for STDs and should talk about the results with each other.  WHAT ARE THE BENEFITS OF SAFE SEX?   · There is less chance of getting or giving an STD.  · You can prevent unwanted or unintended pregnancies.  · By discussing safe sex concerns with your partner, you may increase feelings of intimacy, comfort, trust, and honesty between the two of you.     This information is not intended to replace advice given to you by your health care provider. Make sure you discuss any questions you have with your health care provider.     Document Released: 08/23/2004 Document Revised: 08/06/2014 Document Reviewed: 01/07/2012  Elsevier Interactive Patient Education ©2016 Elsevier Inc.

## 2015-06-22 ENCOUNTER — Encounter: Payer: Self-pay | Admitting: Internal Medicine

## 2015-06-22 DIAGNOSIS — N183 Chronic kidney disease, stage 3 unspecified: Secondary | ICD-10-CM | POA: Insufficient documentation

## 2015-06-22 DIAGNOSIS — A6001 Herpesviral infection of penis: Secondary | ICD-10-CM | POA: Insufficient documentation

## 2015-06-22 LAB — HIV ANTIBODY (ROUTINE TESTING W REFLEX): HIV 1&2 Ab, 4th Generation: NONREACTIVE

## 2015-06-23 ENCOUNTER — Other Ambulatory Visit: Payer: Self-pay | Admitting: Internal Medicine

## 2015-06-23 MED ORDER — EPINEPHRINE 0.3 MG/0.3ML IJ SOAJ: 0.3000 mg | Freq: Once | INTRAMUSCULAR | Status: DC

## 2015-06-23 NOTE — Telephone Encounter (Signed)
This has been corrected.

## 2015-07-14 ENCOUNTER — Telehealth: Payer: Self-pay | Admitting: Internal Medicine

## 2015-07-14 NOTE — Telephone Encounter (Signed)
Rec'd from [facility] forward 15 pages to Dr.Jones

## 2015-07-29 ENCOUNTER — Other Ambulatory Visit: Payer: Self-pay | Admitting: Internal Medicine

## 2015-11-27 ENCOUNTER — Other Ambulatory Visit: Payer: Self-pay | Admitting: Internal Medicine

## 2016-01-05 ENCOUNTER — Telehealth: Payer: Self-pay | Admitting: *Deleted

## 2016-01-05 ENCOUNTER — Encounter: Payer: Self-pay | Admitting: Family

## 2016-01-05 ENCOUNTER — Ambulatory Visit (INDEPENDENT_AMBULATORY_CARE_PROVIDER_SITE_OTHER): Payer: Managed Care, Other (non HMO) | Admitting: Family

## 2016-01-05 VITALS — BP 118/80 | HR 86 | Temp 97.8°F | Ht 72.0 in | Wt 209.0 lb

## 2016-01-05 DIAGNOSIS — H6091 Unspecified otitis externa, right ear: Secondary | ICD-10-CM

## 2016-01-05 DIAGNOSIS — H60391 Other infective otitis externa, right ear: Secondary | ICD-10-CM

## 2016-01-05 MED ORDER — KETOROLAC TROMETHAMINE 30 MG/ML IJ SOLN: 30.0000 mg | Freq: Once | INTRAMUSCULAR | Status: DC

## 2016-01-05 MED ORDER — CIPROFLOXACIN-DEXAMETHASONE 0.3-0.1 % OT SUSP: 4.0000 [drp] | Freq: Two times a day (BID) | OTIC | Status: DC

## 2016-01-05 MED ORDER — KETOROLAC TROMETHAMINE 60 MG/2ML IM SOLN: 30.0000 mg | Freq: Once | INTRAMUSCULAR | Status: DC

## 2016-01-05 MED ORDER — CIPROFLOXACIN-HYDROCORTISONE 0.2-1 % OT SUSP: 3.0000 [drp] | Freq: Two times a day (BID) | OTIC | Status: DC

## 2016-01-05 MED ORDER — METHYLPREDNISOLONE ACETATE 40 MG/ML IJ SUSP: 40.0000 mg | Freq: Once | INTRAMUSCULAR | Status: DC

## 2016-01-05 MED ORDER — KETOROLAC TROMETHAMINE 30 MG/ML IJ SOLN
30.0000 mg | Freq: Once | INTRAMUSCULAR | Status: AC
  Administered 2016-01-05: 30 mg via INTRAMUSCULAR

## 2016-01-05 MED ORDER — METHYLPREDNISOLONE ACETATE 80 MG/ML IJ SUSP: 80.0000 mg | Freq: Once | INTRAMUSCULAR | Status: DC

## 2016-01-05 NOTE — Patient Instructions (Addendum)
Please let us know if your ear is not improving in the next day.   No more naproxen due to your kidney disease.   If there is no improvement in your symptoms, or if there is any worsening of symptoms, or if you have any additional concerns, please return for re-evaluation; or, if we are closed, consider going to the Emergency Room for evaluation if symptoms urgent.   Otitis Externa Otitis externa is a bacterial or fungal infection of the outer ear canal. This is the area from the eardrum to the outside of the ear. Otitis externa is sometimes called "swimmer's ear." CAUSES  Possible causes of infection include:  Swimming in dirty water.  Moisture remaining in the ear after swimming or bathing.  Mild injury (trauma) to the ear.  Objects stuck in the ear (foreign body).  Cuts or scrapes (abrasions) on the outside of the ear. SIGNS AND SYMPTOMS  The first symptom of infection is often itching in the ear canal. Later signs and symptoms may include swelling and redness of the ear canal, ear pain, and yellowish-white fluid (pus) coming from the ear. The ear pain may be worse when pulling on the earlobe. DIAGNOSIS  Your health care provider will perform a physical exam. A sample of fluid may be taken from the ear and examined for bacteria or fungi. TREATMENT  Antibiotic ear drops are often given for 10 to 14 days. Treatment may also include pain medicine or corticosteroids to reduce itching and swelling. HOME CARE INSTRUCTIONS   Apply antibiotic ear drops to the ear canal as prescribed by your health care provider.  Take medicines only as directed by your health care provider.  If you have diabetes, follow any additional treatment instructions from your health care provider.  Keep all follow-up visits as directed by your health care provider. PREVENTION   Keep your ear dry. Use the corner of a towel to absorb water out of the ear canal after swimming or bathing.  Avoid scratching or  putting objects inside your ear. This can damage the ear canal or remove the protective wax that lines the canal. This makes it easier for bacteria and fungi to grow.  Avoid swimming in lakes, polluted water, or poorly chlorinated pools.  You may use ear drops made of rubbing alcohol and vinegar after swimming. Combine equal parts of white vinegar and alcohol in a bottle. Put 3 or 4 drops into each ear after swimming. SEEK MEDICAL CARE IF:   You have a fever.  Your ear is still red, swollen, painful, or draining pus after 3 days.  Your redness, swelling, or pain gets worse.  You have a severe headache.  You have redness, swelling, pain, or tenderness in the area behind your ear. MAKE SURE YOU:   Understand these instructions.  Will watch your condition.  Will get help right away if you are not doing well or get worse.   This information is not intended to replace advice given to you by your health care provider. Make sure you discuss any questions you have with your health care provider.   Document Released: 07/16/2005 Document Revised: 08/06/2014 Document Reviewed: 08/02/2011 Elsevier Interactive Patient Education [employer] Elsevier Inc.

## 2016-01-05 NOTE — Telephone Encounter (Signed)
Please let patient know new medication has been sent to pharmacy.

## 2016-01-05 NOTE — Progress Notes (Signed)
Subjective:    Patient ID: Steven Bauer, male    DOB: 1976-07-06, 40 y.o.   MRN: [id number]   Steven Bauer is a 40 y.o. male who presents today for an acute visit.    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 Patient takes truvada for HIV prophylaxis.     Otalgia  There is pain in the right ear. This is a new problem. The current episode started in the past 7 days. The problem has been gradually worsening. There has been no fever. The pain is moderate. Associated symptoms include hearing loss (chronic). Pertinent negatives include no coughing, ear discharge, sore throat or vomiting. He has tried NSAIDs (naproxen 250 x 3 daily) for the symptoms. The treatment provided mild relief. His past medical history is significant for hearing loss (chronic).   Past Medical History  Diagnosis Date  . PVC (premature ventricular contraction)   . PAC (premature atrial contraction)   . HSV (herpes simplex virus) infection   . Chronic back pain   . Low testosterone    Allergies: Bee venom and Zithromax Current Outpatient Prescriptions on File Prior to Visit  Medication Sig Dispense Refill  . cyclobenzaprine (FLEXERIL) 10 MG tablet Take 1 tablet (10 mg total) by mouth 3 (three) times daily as needed for muscle spasms. 75 tablet 5  . EPINEPHrine 0.3 mg/0.3 mL IJ SOAJ injection Inject 0.3 mLs (0.3 mg total) into the muscle once. 2 Device 11  . escitalopram (LEXAPRO) 10 MG tablet TAKE 1 TABLET (10 MG TOTAL) BY MOUTH AT BEDTIME. 90 tablet 1  . eszopiclone (LUNESTA) 2 MG TABS tablet Take 2 mg by mouth at bedtime as needed for sleep. Take immediately before bedtime    . testosterone cypionate (DEPOTESTOSTERONE CYPIONATE) 200 MG/ML injection INJECT 1 ML INTO THE MUSCLE EVERY 10 DAYS  1  . traMADol (ULTRAM) 50 MG tablet Take 1 tablet (50 mg total) by mouth every 6 (six) hours as needed. 90 tablet 5  . TRUVADA 200-300 MG tablet TAKE 1 TABLET BY MOUTH DAILY. 30 tablet 5  . valACYclovir (VALTREX) 1000 MG tablet  Take 1 tablet (1,000 mg total) by mouth daily. 90 tablet 1   No current facility-administered medications on file prior to visit.    Social History  Substance Use Topics  . Smoking status: Current Every Day Smoker -- 0.50 packs/day    Types: Cigarettes  . Smokeless tobacco: Former Neurosurgeon    Quit date: 06/07/2015  . Alcohol Use: No    Review of Systems  Constitutional: Negative for fever and chills.  HENT: Positive for ear pain and hearing loss (chronic). Negative for ear discharge, facial swelling, sinus pressure, sore throat and tinnitus.   Respiratory: Negative for cough.   Cardiovascular: Negative for chest pain and palpitations.  Gastrointestinal: Negative for nausea and vomiting.      Objective:    BP 118/80 mmHg  Pulse 86  Temp(Src) 97.8 F (36.6 C) (Oral)  Ht 6' (1.829 m)  Wt 209 lb (94.802 kg)  BMI 28.34 kg/m2  SpO2 97%   Physical Exam  Constitutional: Vital signs are normal. He appears well-developed and well-nourished.  HENT:  Head: Normocephalic and atraumatic.  Right Ear: Hearing, tympanic membrane and ear canal normal. There is swelling and tenderness. No drainage. No mastoid tenderness. Tympanic membrane is not injected, not perforated, not erythematous and not bulging. No middle ear effusion. No decreased hearing is noted.  Left Ear: Hearing, tympanic membrane, external ear and ear canal normal.  No drainage, swelling or tenderness. Tympanic membrane is not injected, not erythematous and not bulging.  No middle ear effusion. No decreased hearing is noted.  Nose: Nose normal. Right sinus exhibits no maxillary sinus tenderness and no frontal sinus tenderness. Left sinus exhibits no maxillary sinus tenderness and no frontal sinus tenderness.  Mouth/Throat: Uvula is midline, oropharynx is clear and moist and mucous membranes are normal. No oropharyngeal exudate, posterior oropharyngeal edema, posterior oropharyngeal erythema or tonsillar abscesses.  Moderate right  canal swelling; still able to get otoscope in the ear and visualize part of TM. TM appears intact.   Eyes: Conjunctivae are normal.  Cardiovascular: Regular rhythm and normal heart sounds.   Pulmonary/Chest: Effort normal and breath sounds normal. No respiratory distress. He has no wheezes. He has no rhonchi. He has no rales.  Lymphadenopathy:       Head (right side): No submental, no submandibular, no tonsillar, no preauricular, no posterior auricular and no occipital adenopathy present.       Head (left side): No submental, no submandibular, no tonsillar, no preauricular, no posterior auricular and no occipital adenopathy present.    He has no cervical adenopathy.  Neurological: He is alert.  Skin: Skin is warm and dry.  Psychiatric: He has a normal mood and affect. His speech is normal and behavior is normal.  Vitals reviewed.      Assessment & Plan:   1. Otitis, externa, infective, right Patient has chronic kidney disease and has been taking naproxen at home for pain with only mild relief. He is requesting an injection today. We agreed to give one injection of Toradol IM 30 mg as appropriate dose for renal impairment for his moderate pain. From this point forward, patient will stop taking naproxen PO to protect his kidney function.   Furthermore, patient and I had a long discussion about NSAIDs, Truvada, and renal function and the importance of avoiding agents to cause further damage. I advised that Tylenol is a better agent for mild to moderate pain in the absence of liver disease. Patient verbalized understanding of this plan.  - ciprofloxacin-hydrocortisone (CIPRO HC) otic suspension; Place 3 drops into the right ear 2 (two) times daily.  Dispense: 10 mL; Refill: 0 - ketorolac (TORADOL) 30 MG/ML injection 30 mg; Inject 1 mL (30 mg total) IM    I am having Steven Bauer maintain his testosterone cypionate, eszopiclone, traMADol, cyclobenzaprine, valACYclovir, EPINEPHrine, TRUVADA,  escitalopram, tadalafil, and aspirin EC.   Meds ordered this encounter  Medications  . tadalafil (CIALIS) 10 MG tablet    Sig: Take by mouth.  Marland Kitchen. aspirin EC 325 MG tablet    Sig: Take 325 mg by mouth daily.     Start medications as prescribed and explained to patient on After Visit Summary ( AVS). Risks, benefits, and alternatives of the medications and treatment plan prescribed today were discussed, and patient expressed understanding.   Education regarding symptom management and diagnosis given to patient.   Follow-up:Plan follow-up and return precautions given if any worsening symptoms or change in condition.   Continue to follow with Sanda Lingerhomas Jones, MD for routine health maintenance.   Lajean ManesLarry S Peregrina and I agreed with plan.   Rennie PlowmanMargaret Arnett, FNP

## 2016-01-05 NOTE — Telephone Encounter (Signed)
Notified pt of med change../lmb 

## 2016-01-05 NOTE — Telephone Encounter (Signed)
Pharmacy left msg on triage [facility] is [facility]80 [facility] on med & we would have to order, pt is requesting a cheaper alternative...Raechel Chute/lmb

## 2016-01-05 NOTE — Addendum Note (Signed)
Addended by: Radford PaxAIRRIKIER DAVIDSON, Tkeyah Burkman M on: 01/05/2016 05:07 PM   Modules accepted: Orders

## 2016-01-09 ENCOUNTER — Encounter: Payer: Self-pay | Admitting: Internal Medicine

## 2016-01-09 ENCOUNTER — Ambulatory Visit (INDEPENDENT_AMBULATORY_CARE_PROVIDER_SITE_OTHER): Payer: Managed Care, Other (non HMO) | Admitting: Internal Medicine

## 2016-01-09 ENCOUNTER — Other Ambulatory Visit (INDEPENDENT_AMBULATORY_CARE_PROVIDER_SITE_OTHER): Payer: Managed Care, Other (non HMO)

## 2016-01-09 VITALS — BP 120/78 | HR 81 | Temp 98.0°F | Resp 16 | Ht 72.0 in | Wt 210.0 lb

## 2016-01-09 DIAGNOSIS — E291 Testicular hypofunction: Secondary | ICD-10-CM

## 2016-01-09 DIAGNOSIS — N183 Chronic kidney disease, stage 3 unspecified: Secondary | ICD-10-CM

## 2016-01-09 DIAGNOSIS — R61 Generalized hyperhidrosis: Secondary | ICD-10-CM | POA: Insufficient documentation

## 2016-01-09 LAB — CBC WITH DIFFERENTIAL/PLATELET
BASOS PCT: 0.3 % (ref 0.0–3.0)
Basophils Absolute: 0 10*3/uL (ref 0.0–0.1)
EOS ABS: 0.1 10*3/uL (ref 0.0–0.7)
EOS PCT: 1.9 % (ref 0.0–5.0)
HEMATOCRIT: 48.7 % (ref 39.0–52.0)
HEMOGLOBIN: 16.3 g/dL (ref 13.0–17.0)
LYMPHS PCT: 33.8 % (ref 12.0–46.0)
Lymphs Abs: 2.4 10*3/uL (ref 0.7–4.0)
MCHC: 33.5 g/dL (ref 30.0–36.0)
MCV: 89.9 fl (ref 78.0–100.0)
MONO ABS: 0.5 10*3/uL (ref 0.1–1.0)
Monocytes Relative: 7.7 % (ref 3.0–12.0)
NEUTROS ABS: 3.9 10*3/uL (ref 1.4–7.7)
Neutrophils Relative %: 56.3 % (ref 43.0–77.0)
PLATELETS: 299 10*3/uL (ref 150.0–400.0)
RBC: 5.42 Mil/uL (ref 4.22–5.81)
RDW: 12.9 % (ref 11.5–15.5)
WBC: 7 10*3/uL (ref 4.0–10.5)

## 2016-01-09 LAB — URINALYSIS, ROUTINE W REFLEX MICROSCOPIC
BILIRUBIN URINE: NEGATIVE
HGB URINE DIPSTICK: NEGATIVE
KETONES UR: NEGATIVE
LEUKOCYTES UA: NEGATIVE
NITRITE: NEGATIVE
RBC / HPF: NONE SEEN (ref 0–?)
SPECIFIC GRAVITY, URINE: 1.02 (ref 1.000–1.030)
Total Protein, Urine: NEGATIVE
Urine Glucose: NEGATIVE
Urobilinogen, UA: 0.2 (ref 0.0–1.0)
WBC UA: NONE SEEN (ref 0–?)
pH: 6 (ref 5.0–8.0)

## 2016-01-09 LAB — COMPREHENSIVE METABOLIC PANEL
ALBUMIN: 4.2 g/dL (ref 3.5–5.2)
ALT: 18 U/L (ref 0–53)
AST: 21 U/L (ref 0–37)
Alkaline Phosphatase: 84 U/L (ref 39–117)
BUN: 25 mg/dL — AB (ref 6–23)
CHLORIDE: 102 meq/L (ref 96–112)
CO2: 28 meq/L (ref 19–32)
CREATININE: 1.54 mg/dL — AB (ref 0.40–1.50)
Calcium: 9.6 mg/dL (ref 8.4–10.5)
GFR: 53.9 mL/min — ABNORMAL LOW (ref >60.00)
Glucose, Bld: 84 mg/dL (ref 70–99)
POTASSIUM: 4.7 meq/L (ref 3.5–5.1)
SODIUM: 137 meq/L (ref 135–145)
Total Bilirubin: 0.4 mg/dL (ref 0.2–1.2)
Total Protein: 7 g/dL (ref 6.0–8.3)

## 2016-01-09 MED ORDER — TESTOSTERONE CYPIONATE 200 MG/ML IM SOLN: INTRAMUSCULAR | Status: DC

## 2016-01-09 NOTE — Patient Instructions (Signed)
Safe Sex  Safe sex is about reducing the risk of giving or getting a sexually transmitted disease (STD). STDs are spread through sexual contact involving the genitals, mouth, or rectum. Some STDs can be cured and others cannot. Safe sex can also prevent unintended pregnancies.   WHAT ARE SOME SAFE SEX PRACTICES?  · Limit your sexual activity to only one partner who is having sex with only you.  · Talk to your partner about his or her past partners, past STDs, and drug use.  · Use a condom every time you have sexual intercourse. This includes vaginal, oral, and anal sexual activity. Both females and males should wear condoms during oral sex. Only use latex or polyurethane condoms and water-based lubricants. Using petroleum-based lubricants or oils to lubricate a condom will weaken the condom and increase the chance that it will break. The condom should be in place from the beginning to the end of sexual activity. Wearing a condom reduces, but does not completely eliminate, your risk of getting or giving an STD. STDs can be spread by contact with infected body fluids and skin.  · Get vaccinated for hepatitis B and HPV.  · Avoid alcohol and recreational drugs, which can affect your judgment. You may forget to use a condom or participate in high-risk sex.  · For females, avoid douching after sexual intercourse. Douching can spread an infection farther into the reproductive tract.  · Check your body for signs of sores, blisters, rashes, or unusual discharge. See your health care provider if you notice any of these signs.  · Avoid sexual contact if you have symptoms of an infection or are being treated for an STD. If you or your partner has herpes, avoid sexual contact when blisters are present. Use condoms at all other times.  · If you are at risk of being infected with HIV, it is recommended that you take a prescription medicine daily to prevent HIV infection. This is called pre-exposure prophylaxis (PrEP). You are  considered at risk if:    You are a man who has sex with other men (MSM).    You are a heterosexual man or woman who is sexually active with more than one partner.    You take drugs by injection.    You are sexually active with a partner who has HIV.  · Talk with your health care provider about whether you are at high risk of being infected with HIV. If you choose to begin PrEP, you should first be tested for HIV. You should then be tested every 3 months for as long as you are taking PrEP.  · See your health care provider for regular screenings, exams, and tests for other STDs. Before having sex with a new partner, each of you should be screened for STDs and should talk about the results with each other.  WHAT ARE THE BENEFITS OF SAFE SEX?   · There is less chance of getting or giving an STD.  · You can prevent unwanted or unintended pregnancies.  · By discussing safe sex concerns with your partner, you may increase feelings of intimacy, comfort, trust, and honesty between the two of you.     This information is not intended to replace advice given to you by your health care provider. Make sure you discuss any questions you have with your health care provider.     Document Released: 08/23/2004 Document Revised: 08/06/2014 Document Reviewed: 01/07/2012  Elsevier Interactive Patient Education ©2016 Elsevier Inc.

## 2016-01-09 NOTE — Progress Notes (Signed)
Subjective:  Patient ID: Steven Bauer, male    DOB: 1975-12-30  Age: 40 y.o. MRN: [id number]  CC: Night Sweats   HPI Steven Bauer presents for follow-up. He was seen here about a week ago for right otitis media and was placed on eardrops. The right ear feels much better. He tells me over the last week he has had a few episodes of diarrhea, sore throat and night sweats. He denies fever, chills, cough, rash, lymphadenopathy, headache, dysuria, penile ulcers, penile discharge, or bloody stool.  He has a history of hypogonadism and wants me to start prescribing his testosterone replacement therapy. He tells me he is doing well on the current dose.   Outpatient Prescriptions Prior to Visit  Medication Sig Dispense Refill  . aspirin EC 325 MG tablet Take 325 mg by mouth daily.    . ciprofloxacin-dexamethasone (CIPRODEX) otic suspension Place 4 drops into the right ear 2 (two) times daily. 7.5 mL 0  . cyclobenzaprine (FLEXERIL) 10 MG tablet Take 1 tablet (10 mg total) by mouth 3 (three) times daily as needed for muscle spasms. 75 tablet 5  . EPINEPHrine 0.3 mg/0.3 mL IJ SOAJ injection Inject 0.3 mLs (0.3 mg total) into the muscle once. 2 Device 11  . escitalopram (LEXAPRO) 10 MG tablet TAKE 1 TABLET (10 MG TOTAL) BY MOUTH AT BEDTIME. 90 tablet 1  . eszopiclone (LUNESTA) 2 MG TABS tablet Take 2 mg by mouth at bedtime as needed for sleep. Take immediately before bedtime    . tadalafil (CIALIS) 10 MG tablet Take by mouth.    . traMADol (ULTRAM) 50 MG tablet Take 1 tablet (50 mg total) by mouth every 6 (six) hours as needed. 90 tablet 5  . TRUVADA 200-300 MG tablet TAKE 1 TABLET BY MOUTH DAILY. 30 tablet 5  . valACYclovir (VALTREX) 1000 MG tablet Take 1 tablet (1,000 mg total) by mouth daily. 90 tablet 1  . testosterone cypionate (DEPOTESTOSTERONE CYPIONATE) 200 MG/ML injection INJECT 1 ML INTO THE MUSCLE EVERY 10 DAYS  1   Facility-Administered Medications Prior to Visit  Medication Dose Route  Frequency Provider Last Rate Last Dose  . methylPREDNISolone acetate (DEPO-MEDROL) injection 40 mg  40 mg Intramuscular Once Allegra Grana, FNP   40 mg at 01/05/16 1705  . methylPREDNISolone acetate (DEPO-MEDROL) injection 80 mg  80 mg Intramuscular Once Allegra Grana, FNP   80 mg at 01/05/16 1704    ROS Review of Systems  Constitutional: Positive for diaphoresis. Negative for fever, chills, activity change, appetite change, fatigue and unexpected weight change.  HENT: Positive for sore throat. Negative for congestion, facial swelling, sinus pressure and trouble swallowing.   Eyes: Negative.   Respiratory: Negative.  Negative for cough, choking, chest tightness, shortness of breath and stridor.   Cardiovascular: Negative.  Negative for chest pain, palpitations and leg swelling.  Gastrointestinal: Positive for diarrhea. Negative for nausea, vomiting, abdominal pain and blood in stool.  Endocrine: Negative.   Genitourinary: Negative.  Negative for dysuria, discharge, penile swelling, scrotal swelling, difficulty urinating, genital sores, penile pain and testicular pain.  Musculoskeletal: Negative.  Negative for myalgias, back pain, joint swelling, arthralgias and neck pain.  Skin: Negative.  Negative for color change and rash.  Allergic/Immunologic: Negative.   Neurological: Negative.   Hematological: Negative.  Negative for adenopathy. Does not bruise/bleed easily.  Psychiatric/Behavioral: Negative.     Objective:  BP 120/78 mmHg  Pulse 81  Temp(Src) 98 F (36.7 C) (Oral)  Resp 16  Ht 6' (1.829 m)  Wt 210 lb (95.255 kg)  BMI 28.47 kg/m2  SpO2 97%  BP Readings from Last 3 Encounters:  01/09/16 120/78  01/05/16 118/80  06/21/15 118/64    Wt Readings from Last 3 Encounters:  01/09/16 210 lb (95.255 kg)  01/05/16 209 lb (94.802 kg)  06/21/15 212 lb (96.163 kg)    Physical Exam  Constitutional: He is oriented to person, place, and time. No distress.  HENT:  Right  Ear: Hearing, tympanic membrane, external ear and ear canal normal.  Left Ear: Hearing, tympanic membrane, external ear and ear canal normal.  Mouth/Throat: Oropharynx is clear and moist. No oropharyngeal exudate.  His right EAC is very mildly swollen  Eyes: Conjunctivae are normal. Right eye exhibits no discharge. Left eye exhibits no discharge. No scleral icterus.  Neck: Normal range of motion. Neck supple. No JVD present. No tracheal deviation present. No thyromegaly present.  Cardiovascular: Normal rate, regular rhythm, normal heart sounds and intact distal pulses.  Exam reveals no gallop and no friction rub.   No murmur heard. Pulmonary/Chest: Effort normal and breath sounds normal. No stridor. No respiratory distress. He has no wheezes. He has no rales. He exhibits no tenderness.  Abdominal: Soft. Bowel sounds are normal. He exhibits no distension and no mass. There is no tenderness. There is no rebound and no guarding.  Musculoskeletal: Normal range of motion. He exhibits no edema or tenderness.  Lymphadenopathy:    He has no cervical adenopathy.  Neurological: He is oriented to person, place, and time.  Skin: Skin is warm and dry. No rash noted. He is not diaphoretic. No erythema. No pallor.  Vitals reviewed.   Lab Results  Component Value Date   WBC 7.0 01/09/2016   HGB 16.3 01/09/2016   HCT 48.7 01/09/2016   PLT 299.0 01/09/2016   GLUCOSE 84 01/09/2016   CHOL 177 06/21/2015   TRIG 185.0* 06/21/2015   HDL 33.30* 06/21/2015   LDLCALC 107* 06/21/2015   ALT 18 01/09/2016   AST 21 01/09/2016   NA 137 01/09/2016   K 4.7 01/09/2016   CL 102 01/09/2016   CREATININE 1.54* 01/09/2016   BUN 25* 01/09/2016   CO2 28 01/09/2016   TSH 1.60 06/21/2015    No results found.  Assessment & Plan:   Steven Bauer was seen today for night sweats.  Diagnoses and all orders for this visit:  Kidney disease, chronic, stage III (GFR 30-59 ml/min)- his calculated CrCl is actually 78, I think he  maintains a high creatinine due to his muscle mass, I do not think he has any intrinsic renal disease, he is tolerating Truvada well -     Urinalysis, Rout[facility] w reflex microscopic [facility]); Future -     Comprehensive metabolic panel; Future -     CBC with Differential/Platelet; Future  Night sweats- I think he is having a viral syndrome, we'll screen for sexually transmitted infections -     RPR; Future -     HIV antibody; Future -     CBC with Differential/Platelet; Future -     GC/chlamydia probe amp, urine; Future  Hypogonadism male- we'll continue the current dose of testosterone, will monitor his blood work today for complications such as erythrocytosis or elevated liver enzymes. -     CBC with Differential/Platelet; Future -     Testosterone, Free, Total, SHBG; Future  Other orders -     testosterone cypionate (DEPOTESTOSTERONE CYPIONATE) 200 MG/ML injection; INJECT 1 ML  INTO THE MUSCLE EVERY 10 DAYS  I am having Mr. Leason maintain his eszopiclone, traMADol, cyclobenzaprine, valACYclovir, EPINEPHrine, TRUVADA, escitalopram, tadalafil, aspirin EC, ciprofloxacin-dexamethasone, and testosterone cypionate. We will continue to administer methylPREDNISolone acetate and methylPREDNISolone acetate.  Meds ordered this encounter  Medications  . testosterone cypionate (DEPOTESTOSTERONE CYPIONATE) 200 MG/ML injection    Sig: INJECT 1 ML INTO THE MUSCLE EVERY 10 DAYS    Dispense:  10 mL    Refill:  1     Follow-up: Return in about 4 months (around 05/10/2016).  Sanda Linger, MD

## 2016-01-09 NOTE — Progress Notes (Signed)
Pre visit review using our clinic review tool, if applicable. No additional management support is needed unless otherwise documented below in the visit note. 

## 2016-01-10 ENCOUNTER — Encounter: Payer: Self-pay | Admitting: Internal Medicine

## 2016-01-10 LAB — HIV ANTIBODY (ROUTINE TESTING W REFLEX): HIV 1&2 Ab, 4th Generation: NONREACTIVE

## 2016-01-10 LAB — RPR

## 2016-01-10 LAB — GC/CHLAMYDIA PROBE AMP
CT Probe RNA: NOT DETECTED
GC Probe RNA: NOT DETECTED

## 2016-01-24 ENCOUNTER — Other Ambulatory Visit: Payer: Self-pay | Admitting: Internal Medicine

## 2016-05-22 ENCOUNTER — Other Ambulatory Visit: Payer: Self-pay | Admitting: Internal Medicine

## 2016-06-20 ENCOUNTER — Other Ambulatory Visit: Payer: Self-pay | Admitting: Internal Medicine

## 2016-06-20 DIAGNOSIS — M549 Dorsalgia, unspecified: Secondary | ICD-10-CM

## 2016-06-20 NOTE — Telephone Encounter (Signed)
rx faxed to pharmacy on file.  

## 2016-07-03 ENCOUNTER — Other Ambulatory Visit: Payer: Self-pay | Admitting: Internal Medicine

## 2016-07-03 ENCOUNTER — Encounter: Payer: Self-pay | Admitting: Internal Medicine

## 2016-07-03 DIAGNOSIS — J0100 Acute maxillary sinusitis, unspecified: Secondary | ICD-10-CM

## 2016-07-03 MED ORDER — CEFDINIR 300 MG PO CAPS: 300.0000 mg | ORAL_CAPSULE | Freq: Two times a day (BID) | ORAL | 1 refills | Status: AC

## 2016-07-03 MED ORDER — METHYLPREDNISOLONE 4 MG PO TBPK
ORAL_TABLET | ORAL | 0 refills | Status: DC
Start: 2016-07-03 — End: 2016-07-19

## 2016-07-09 ENCOUNTER — Other Ambulatory Visit: Payer: Self-pay | Admitting: Internal Medicine

## 2016-07-16 ENCOUNTER — Other Ambulatory Visit: Payer: Self-pay | Admitting: Internal Medicine

## 2016-07-16 ENCOUNTER — Encounter: Payer: Self-pay | Admitting: Internal Medicine

## 2016-07-17 NOTE — Telephone Encounter (Signed)
Patient is scheduled for Thursday at 4:15pm

## 2016-07-19 ENCOUNTER — Other Ambulatory Visit (INDEPENDENT_AMBULATORY_CARE_PROVIDER_SITE_OTHER): Payer: Managed Care, Other (non HMO)

## 2016-07-19 ENCOUNTER — Encounter: Payer: Self-pay | Admitting: Internal Medicine

## 2016-07-19 ENCOUNTER — Ambulatory Visit (INDEPENDENT_AMBULATORY_CARE_PROVIDER_SITE_OTHER): Payer: Managed Care, Other (non HMO) | Admitting: Internal Medicine

## 2016-07-19 VITALS — BP 132/80 | HR 87 | Temp 98.2°F | Resp 16 | Ht 72.0 in | Wt 209.8 lb

## 2016-07-19 DIAGNOSIS — E291 Testicular hypofunction: Secondary | ICD-10-CM | POA: Diagnosis not present

## 2016-07-19 DIAGNOSIS — M549 Dorsalgia, unspecified: Secondary | ICD-10-CM

## 2016-07-19 DIAGNOSIS — Z0000 Encounter for general adult medical examination without abnormal findings: Secondary | ICD-10-CM | POA: Diagnosis not present

## 2016-07-19 DIAGNOSIS — N183 Chronic kidney disease, stage 3 unspecified: Secondary | ICD-10-CM

## 2016-07-19 DIAGNOSIS — R7989 Other specified abnormal findings of blood chemistry: Secondary | ICD-10-CM

## 2016-07-19 DIAGNOSIS — Z299 Encounter for prophylactic measures, unspecified: Secondary | ICD-10-CM

## 2016-07-19 DIAGNOSIS — Z7251 High risk heterosexual behavior: Secondary | ICD-10-CM | POA: Diagnosis not present

## 2016-07-19 DIAGNOSIS — G8929 Other chronic pain: Secondary | ICD-10-CM

## 2016-07-19 DIAGNOSIS — M545 Low back pain, unspecified: Secondary | ICD-10-CM

## 2016-07-19 LAB — CBC WITH DIFFERENTIAL/PLATELET
BASOS PCT: 0.5 % (ref 0.0–3.0)
Basophils Absolute: 0.1 10*3/uL (ref 0.0–0.1)
EOS PCT: 3.8 % (ref 0.0–5.0)
Eosinophils Absolute: 0.4 10*3/uL (ref 0.0–0.7)
HCT: 50.6 % (ref 39.0–52.0)
Hemoglobin: 17.5 g/dL — ABNORMAL HIGH (ref 13.0–17.0)
LYMPHS ABS: 3.3 10*3/uL (ref 0.7–4.0)
Lymphocytes Relative: 34.5 % (ref 12.0–46.0)
MCHC: 34.5 g/dL (ref 30.0–36.0)
MCV: 90.2 fl (ref 78.0–100.0)
MONO ABS: 0.7 10*3/uL (ref 0.1–1.0)
Monocytes Relative: 7.7 % (ref 3.0–12.0)
NEUTROS PCT: 53.5 % (ref 43.0–77.0)
Neutro Abs: 5.1 10*3/uL (ref 1.4–7.7)
Platelets: 302 10*3/uL (ref 150.0–400.0)
RBC: 5.61 Mil/uL (ref 4.22–5.81)
RDW: 13.4 % (ref 11.5–15.5)
WBC: 9.6 10*3/uL (ref 4.0–10.5)

## 2016-07-19 LAB — COMPREHENSIVE METABOLIC PANEL
ALT: 20 U/L (ref 0–53)
AST: 19 U/L (ref 0–37)
Albumin: 4.6 g/dL (ref 3.5–5.2)
Alkaline Phosphatase: 93 U/L (ref 39–117)
BUN: 14 mg/dL (ref 6–23)
CHLORIDE: 99 meq/L (ref 96–112)
CO2: 31 mEq/L (ref 19–32)
Calcium: 9.4 mg/dL (ref 8.4–10.5)
Creatinine, Ser: 1.45 mg/dL (ref 0.40–1.50)
GFR: 57.62 mL/min — ABNORMAL LOW (ref >60.00)
GLUCOSE: 89 mg/dL (ref 70–99)
POTASSIUM: 3.8 meq/L (ref 3.5–5.1)
SODIUM: 136 meq/L (ref 135–145)
Total Bilirubin: 0.4 mg/dL (ref 0.2–1.2)
Total Protein: 7.5 g/dL (ref 6.0–8.3)

## 2016-07-19 LAB — URINALYSIS, ROUTINE W REFLEX MICROSCOPIC
Bilirubin Urine: NEGATIVE
Hgb urine dipstick: NEGATIVE
KETONES UR: NEGATIVE
Leukocytes, UA: NEGATIVE
NITRITE: NEGATIVE
Total Protein, Urine: NEGATIVE
Urine Glucose: NEGATIVE
Urobilinogen, UA: 0.2 (ref 0.0–1.0)
pH: 5.5 (ref 5.0–8.0)

## 2016-07-19 LAB — LIPID PANEL
CHOLESTEROL: 243 mg/dL — AB (ref 0–200)
HDL: 36.1 mg/dL — ABNORMAL LOW (ref >39.00)
Total CHOL/HDL Ratio: 7

## 2016-07-19 LAB — LDL CHOLESTEROL, DIRECT: LDL DIRECT: 158 mg/dL

## 2016-07-19 MED ORDER — TRAMADOL HCL 50 MG PO TABS: 50.0000 mg | ORAL_TABLET | Freq: Four times a day (QID) | ORAL | 3 refills | Status: DC | PRN

## 2016-07-19 MED ORDER — TESTOSTERONE CYPIONATE 200 MG/ML IM SOLN: INTRAMUSCULAR | 1 refills | Status: DC

## 2016-07-19 NOTE — Progress Notes (Signed)
Subjective:  Patient ID: Steven Bauer, male    DOB: 04/09/1976  Age: 40 y.o. MRN: [id number]  CC: Annual Exam   HPI Steven Bauer presents for a CPX.  He believes his sexual practices are still high risk and wants to continue taking Truvada. He is tolerating it well with no abdominal pain, nausea, vomiting, dysuria, loss of appetite, or weight loss.   He has chronic, nonradiating low back pain and wants a refill on tramadol.  Outpatient Medications Prior to Visit  Medication Sig Dispense Refill  . EPINEPHrine 0.3 mg/0.3 mL IJ SOAJ injection Inject 0.3 mLs (0.3 mg total) into the muscle once. 2 Device 11  . escitalopram (LEXAPRO) 10 MG tablet TAKE 1 TABLET (10 MG TOTAL) BY MOUTH AT BEDTIME. 90 tablet 1  . tadalafil (CIALIS) 10 MG tablet Take by mouth.    . TRUVADA 200-300 MG tablet TAKE 1 TABLET BY MOUTH DAILY. 30 tablet 0  . cyclobenzaprine (FLEXERIL) 10 MG tablet Take 1 tablet (10 mg total) by mouth 3 (three) times daily as needed for muscle spasms. 75 tablet 5  . methylPREDNISolone (MEDROL DOSEPAK) 4 MG TBPK tablet TAKE AS DIRECTED 21 tablet 0  . testosterone cypionate (DEPOTESTOSTERONE CYPIONATE) 200 MG/ML injection INJECT 1 ML INTO THE MUSCLE EVERY 10 DAYS 10 mL 1  . traMADol (ULTRAM) 50 MG tablet TAKE 1 TABLET BY MOUTH EVERY 6 HOURS AS NEEDED 90 tablet 2  . valACYclovir (VALTREX) 1000 MG tablet Take 1 tablet (1,000 mg total) by mouth daily. 90 tablet 1  . aspirin EC 325 MG tablet Take 325 mg by mouth daily.    . eszopiclone (LUNESTA) 2 MG TABS tablet Take 2 mg by mouth at bedtime as needed for sleep. Take immediately before bedtime    . ciprofloxacin-dexamethasone (CIPRODEX) otic suspension Place 4 drops into the right ear 2 (two) times daily. (Patient not taking: Reported on 07/19/2016) 7.5 mL 0  . methylPREDNISolone acetate (DEPO-MEDROL) injection 40 mg     . methylPREDNISolone acetate (DEPO-MEDROL) injection 80 mg      No facility-administered medications prior to visit.       ROS Review of Systems  Constitutional: Negative for appetite change, chills, diaphoresis, fatigue and unexpected weight change.  HENT: Negative.  Negative for sore throat and trouble swallowing.   Eyes: Negative.  Negative for photophobia and visual disturbance.  Respiratory: Negative for cough, chest tightness, shortness of breath, wheezing and stridor.   Cardiovascular: Negative for chest pain, palpitations and leg swelling.  Gastrointestinal: Negative for abdominal pain, blood in stool, constipation, diarrhea, nausea and vomiting.  Endocrine: Negative.   Genitourinary: Negative.  Negative for difficulty urinating, discharge, dysuria, genital sores, penile swelling, scrotal swelling, testicular pain and urgency.  Musculoskeletal: Positive for back pain. Negative for neck pain.  Skin: Negative.  Negative for color change, pallor and rash.  Allergic/Immunologic: Negative.   Neurological: Negative for weakness, numbness and headaches.  Hematological: Negative for adenopathy. Does not bruise/bleed easily.  Psychiatric/Behavioral: Negative.     Objective:  BP 132/80 (BP Location: Left Arm, Patient Position: Sitting, Cuff Size: Normal)   Pulse 87   Temp 98.2 F (36.8 C) (Oral)   Resp 16   Ht 6' (1.829 m)   Wt 209 lb 12 oz (95.1 kg)   SpO2 97%   BMI 28.45 kg/m   BP Readings from Last 3 Encounters:  07/19/16 132/80  01/09/16 120/78  01/05/16 118/80    Wt Readings from Last 3 Encounters:  07/19/16 209  lb 12 oz (95.1 kg)  01/09/16 210 lb (95.3 kg)  01/05/16 209 lb (94.8 kg)    Physical Exam  Constitutional: He is oriented to person, place, and time. No distress.  HENT:  Nose: Nose normal.  Mouth/Throat: Oropharynx is clear and moist. No oropharyngeal exudate.  Eyes: Conjunctivae are normal. Right eye exhibits no discharge. Left eye exhibits no discharge. No scleral icterus.  Neck: Normal range of motion. Neck supple. No JVD present. No tracheal deviation present. No  thyromegaly present.  Cardiovascular: Normal rate, regular rhythm, normal heart sounds and intact distal pulses.  Exam reveals no gallop and no friction rub.   No murmur heard. Pulmonary/Chest: Effort normal and breath sounds normal. No stridor. No respiratory distress. He has no wheezes. He has no rales. He exhibits no tenderness.  Abdominal: Soft. Bowel sounds are normal. He exhibits no distension and no mass. There is no tenderness. There is no rebound and no guarding.  Musculoskeletal: Normal range of motion. He exhibits no edema, tenderness or deformity.  Lymphadenopathy:    He has no cervical adenopathy.  Neurological: He is oriented to person, place, and time.  Skin: Skin is warm and dry. No rash noted. He is not diaphoretic. No erythema. No pallor.  Psychiatric: He has a normal mood and affect. His behavior is normal. Judgment and thought content normal.  Vitals reviewed.   Lab Results  Component Value Date   WBC 9.6 07/19/2016   HGB 17.5 (H) 07/19/2016   HCT 50.6 07/19/2016   PLT 302.0 07/19/2016   GLUCOSE 89 07/19/2016   CHOL 243 (H) 07/19/2016   TRIG (H) 07/19/2016    552.0 Triglyceride is over 400; calculations on Lipids are invalid.   HDL 36.10 (L) 07/19/2016   LDLDIRECT 158.0 07/19/2016   LDLCALC 107 (H) 06/21/2015   ALT 20 07/19/2016   AST 19 07/19/2016   NA 136 07/19/2016   K 3.8 07/19/2016   CL 99 07/19/2016   CREATININE 1.45 07/19/2016   BUN 14 07/19/2016   CO2 31 07/19/2016   TSH 1.60 06/21/2015    No results found.  Assessment & Plan:   [name] was seen today for annual exam.  Diagnoses and all orders for this visit:  High risk sexual behavior- he screens negative for hep B and HIV, he is also negative for any other sexually transmitted infections. His creatinine clearance is above 6070 can continue taking Truvada. -     Urinalysis, Routine w reflex microscopic; Future -     Hepatitis B surface antigen; Future -     RPR; Future -     HIV antibody;  Future -     GC/Chlamydia Probe Amp; Future  Routine general medical examination at a health care facility- exam completed, labs ordered and reviewed, vaccines reviewed and updated, patient education material was given. -     Lipid panel; Future -     CBC with Differential/Platelet; Future -     Comprehensive metabolic panel; Future  Encounter for preventive measure  Low back pain at multiple sites  Chronic bilateral back pain, unspecified back location -     traMADol (ULTRAM) 50 MG tablet; Take 1 tablet (50 mg total) by mouth every 6 (six) hours as needed.  Hypogonadism male -     testosterone cypionate (DEPOTESTOSTERONE CYPIONATE) 200 MG/ML injection; INJECT 1 ML INTO THE MUSCLE EVERY 10 DAYS  Kidney disease, chronic, stage III (GFR 30-59 ml/min)- his GFR appears low due to his large muscle mass but  his creatinine clearance actually shows a relatively normal renal function. I will continue to monitor this.   I have discontinued Mr. Onorato cyclobenzaprine, valACYclovir, ciprofloxacin-dexamethasone, and methylPREDNISolone. I have also changed his traMADol. Additionally, I am having him maintain his eszopiclone, EPINEPHrine, tadalafil, aspirin EC, escitalopram, TRUVADA, and testosterone cypionate. We will stop administering methylPREDNISolone acetate and methylPREDNISolone acetate.  Meds ordered this encounter  Medications  . traMADol (ULTRAM) 50 MG tablet    Sig: Take 1 tablet (50 mg total) by mouth every 6 (six) hours as needed.    Dispense:  90 tablet    Refill:  3    This request is for a new prescription for a controlled substance as required by Federal/State law.  . testosterone cypionate (DEPOTESTOSTERONE CYPIONATE) 200 MG/ML injection    Sig: INJECT 1 ML INTO THE MUSCLE EVERY 10 DAYS    Dispense:  10 mL    Refill:  1     Follow-up: Return in about 4 months (around 11/17/2016).  Sanda Linger, MD

## 2016-07-19 NOTE — Progress Notes (Signed)
Pre visit review using our clinic review tool, if applicable. No additional management support is needed unless otherwise documented below in the visit note. 

## 2016-07-19 NOTE — Telephone Encounter (Signed)
yes

## 2016-07-19 NOTE — Patient Instructions (Addendum)

## 2016-07-20 ENCOUNTER — Encounter: Payer: Self-pay | Admitting: Internal Medicine

## 2016-07-20 LAB — HIV ANTIBODY (ROUTINE TESTING W REFLEX): HIV 1&2 Ab, 4th Generation: NONREACTIVE

## 2016-07-20 LAB — RPR

## 2016-07-20 LAB — GC/CHLAMYDIA PROBE AMP
CT Probe RNA: NOT DETECTED
GC PROBE AMP APTIMA: NOT DETECTED

## 2016-07-20 LAB — HEPATITIS B SURFACE ANTIGEN: Hepatitis B Surface Ag: NEGATIVE

## 2016-07-25 ENCOUNTER — Encounter: Payer: Self-pay | Admitting: Internal Medicine

## 2016-07-25 ENCOUNTER — Other Ambulatory Visit: Payer: Self-pay | Admitting: Internal Medicine

## 2016-07-26 NOTE — Telephone Encounter (Signed)
Call him - not sure what Dr Yetta BarreJones advised him.  Sounds like he had a refill on antibiotics and steroids at the pharmacy.  If he still having those symptoms he should take them and follow up if not better.

## 2016-07-26 NOTE — Telephone Encounter (Signed)
Spoke to pt and informed that abx has a refill and to complete those. Will wait for PCP regarding prednisone refill.

## 2016-08-15 ENCOUNTER — Other Ambulatory Visit: Payer: Self-pay | Admitting: Internal Medicine

## 2016-08-17 ENCOUNTER — Other Ambulatory Visit: Payer: Self-pay | Admitting: Internal Medicine

## 2016-08-17 MED ORDER — EMTRICITABINE-TENOFOVIR DF 200-300 MG PO TABS: 1.0000 | ORAL_TABLET | Freq: Every day | ORAL | 1 refills | Status: DC

## 2016-08-31 ENCOUNTER — Other Ambulatory Visit: Payer: Self-pay | Admitting: Internal Medicine

## 2016-08-31 MED ORDER — EMTRICITABINE-TENOFOVIR DF 200-300 MG PO TABS: 1.0000 | ORAL_TABLET | Freq: Every day | ORAL | 1 refills | Status: DC

## 2016-10-08 ENCOUNTER — Other Ambulatory Visit: Payer: Self-pay | Admitting: Internal Medicine

## 2016-10-08 ENCOUNTER — Encounter: Payer: Self-pay | Admitting: Internal Medicine

## 2016-10-08 MED ORDER — CEFDINIR 300 MG PO CAPS: 300.0000 mg | ORAL_CAPSULE | Freq: Two times a day (BID) | ORAL | 0 refills | Status: DC

## 2016-10-10 ENCOUNTER — Ambulatory Visit (INDEPENDENT_AMBULATORY_CARE_PROVIDER_SITE_OTHER): Payer: Managed Care, Other (non HMO) | Admitting: Internal Medicine

## 2016-10-10 ENCOUNTER — Encounter: Payer: Self-pay | Admitting: Internal Medicine

## 2016-10-10 VITALS — BP 148/86 | HR 80 | Temp 99.0°F | Resp 16 | Wt 222.0 lb

## 2016-10-10 DIAGNOSIS — J309 Allergic rhinitis, unspecified: Secondary | ICD-10-CM | POA: Insufficient documentation

## 2016-10-10 DIAGNOSIS — H6981 Other specified disorders of Eustachian tube, right ear: Secondary | ICD-10-CM | POA: Diagnosis not present

## 2016-10-10 DIAGNOSIS — H6501 Acute serous otitis media, right ear: Secondary | ICD-10-CM

## 2016-10-10 DIAGNOSIS — J301 Allergic rhinitis due to pollen: Secondary | ICD-10-CM

## 2016-10-10 DIAGNOSIS — H6690 Otitis media, unspecified, unspecified ear: Secondary | ICD-10-CM | POA: Insufficient documentation

## 2016-10-10 MED ORDER — CETIRIZINE-PSEUDOEPHEDRINE ER 5-120 MG PO TB12: 1.0000 | ORAL_TABLET | Freq: Two times a day (BID) | ORAL | 1 refills | Status: DC

## 2016-10-10 MED ORDER — METHYLPREDNISOLONE ACETATE 80 MG/ML IJ SUSP
120.0000 mg | Freq: Once | INTRAMUSCULAR | Status: AC
  Administered 2016-10-10: 120 mg via INTRAMUSCULAR

## 2016-10-10 MED ORDER — AMOXICILLIN-POT CLAVULANATE 875-125 MG PO TABS: 1.0000 | ORAL_TABLET | Freq: Two times a day (BID) | ORAL | 0 refills | Status: AC

## 2016-10-10 MED ORDER — FLUTICASONE PROPIONATE 50 MCG/ACT NA SUSP: 2.0000 | Freq: Every day | NASAL | 11 refills | Status: DC

## 2016-10-10 MED ORDER — HYDROCODONE-ACETAMINOPHEN 5-325 MG PO TABS: 1.0000 | ORAL_TABLET | Freq: Four times a day (QID) | ORAL | 0 refills | Status: DC | PRN

## 2016-10-10 NOTE — Patient Instructions (Signed)

## 2016-10-10 NOTE — Progress Notes (Signed)
Subjective:  Patient ID: Steven Bauer, male    DOB: [birth year]  Age: 41 y.o. MRN: [id number]  CC: Otitis Media; URI; and Allergic Rhinitis    HPI Steven Bauer presents for A one-week history of sore throat, lymphadenopathy in his neck, right ear pain with loss of hearing, sinus pressure, nasal congestion, dizziness, and vertigo. He has been taking Ceftin ear without any improvement in his symptoms.  Outpatient Medications Prior to Visit  Medication Sig Dispense Refill  . aspirin EC 325 MG tablet Take 325 mg by mouth daily.    Marland Kitchen emtricitabine-tenofovir (TRUVADA) 200-300 MG tablet Take 1 tablet by mouth daily. 90 tablet 1  . EPINEPHrine 0.3 mg/0.3 mL IJ SOAJ injection Inject 0.3 mLs (0.3 mg total) into the muscle once. 2 Device 11  . escitalopram (LEXAPRO) 10 MG tablet TAKE 1 TABLET (10 MG TOTAL) BY MOUTH AT BEDTIME. 90 tablet 1  . eszopiclone (LUNESTA) 2 MG TABS tablet Take 2 mg by mouth at bedtime as needed for sleep. Take immediately before bedtime    . tadalafil (CIALIS) 10 MG tablet Take by mouth.    . testosterone cypionate (DEPOTESTOSTERONE CYPIONATE) 200 MG/ML injection INJECT 1 ML INTO THE MUSCLE EVERY 10 DAYS 10 mL 1  . traMADol (ULTRAM) 50 MG tablet Take 1 tablet (50 mg total) by mouth every 6 (six) hours as needed. 90 tablet 3  . cefdinir (OMNICEF) 300 MG capsule Take 1 capsule (300 mg total) by mouth 2 (two) times daily. 20 capsule 0   No facility-administered medications prior to visit.     ROS Review of Systems  Constitutional: Negative for appetite change, chills, diaphoresis, fatigue, fever and unexpected weight change.  HENT: Positive for congestion, ear pain, hearing loss, rhinorrhea, sinus pain and sore throat. Negative for ear discharge, facial swelling and trouble swallowing.   Eyes: Negative for visual disturbance.  Respiratory: Negative for cough, chest tightness, shortness of breath, wheezing and stridor.   Cardiovascular: Negative for chest pain,  palpitations and leg swelling.  Gastrointestinal: Negative for abdominal pain, diarrhea, nausea and vomiting.  Endocrine: Negative.   Genitourinary: Negative.   Musculoskeletal: Negative.   Allergic/Immunologic: Negative.   Neurological: Negative.   Hematological: Negative.   Psychiatric/Behavioral: Negative.     Objective:  BP (!) 148/86 (BP Location: Left Arm, Patient Position: Sitting)   Pulse 80   Temp 99 F (37.2 C) (Oral)   Resp 16   Wt 222 lb (100.7 kg)   SpO2 98%   BMI 30.11 kg/m   BP Readings from Last 3 Encounters:  10/10/16 (!) 148/86  07/19/16 132/80  01/09/16 120/78    Wt Readings from Last 3 Encounters:  10/10/16 222 lb (100.7 kg)  07/19/16 209 lb 12 oz (95.1 kg)  01/09/16 210 lb (95.3 kg)    Physical Exam  Constitutional: He is oriented to person, place, and time.  Non-toxic appearance. He does not have a sickly appearance. He does not appear ill. No distress.  HENT:  Right Ear: No swelling. No foreign bodies. No mastoid tenderness. Tympanic membrane is injected, retracted and bulging. Tympanic membrane is not scarred and not erythematous. A middle ear effusion is present. No hemotympanum. Decreased hearing is noted.  Left Ear: No mastoid tenderness. Tympanic membrane is injected. Tympanic membrane is not erythematous, not retracted and not bulging.  No middle ear effusion. No decreased hearing is noted.  Mouth/Throat: Mucous membranes are not pale, not dry and not cyanotic. No oral lesions. No trismus in the  jaw. No uvula swelling. Posterior oropharyngeal erythema present. No oropharyngeal exudate or posterior oropharyngeal edema.  Eyes: Conjunctivae are normal. Right eye exhibits no discharge. Left eye exhibits no discharge. No scleral icterus.  Neck: Normal range of motion. Neck supple. No JVD present. No tracheal deviation present. No thyromegaly present.  Cardiovascular: Normal rate, regular rhythm, normal heart sounds and intact distal pulses.  Exam  reveals no gallop and no friction rub.   No murmur heard. Pulmonary/Chest: Effort normal and breath sounds normal. No stridor. No respiratory distress. He has no wheezes. He has no rales. He exhibits no tenderness.  Abdominal: Soft. Bowel sounds are normal. He exhibits no distension and no mass. There is no tenderness. There is no rebound and no guarding.  Musculoskeletal: Normal range of motion. He exhibits no edema, tenderness or deformity.  Lymphadenopathy:    He has no cervical adenopathy.  Neurological: He is oriented to person, place, and time.  Skin: Skin is warm and dry. No rash noted. He is not diaphoretic. No erythema. No pallor.  Vitals reviewed.   Lab Results  Component Value Date   WBC 9.6 07/19/2016   HGB 17.5 (H) 07/19/2016   HCT 50.6 07/19/2016   PLT 302.0 07/19/2016   GLUCOSE 89 07/19/2016   CHOL 243 (H) 07/19/2016   TRIG (H) 07/19/2016    552.0 Triglyceride is over 400; calculations on Lipids are invalid.   HDL 36.10 (L) 07/19/2016   LDLDIRECT 158.0 07/19/2016   LDLCALC 107 (H) 06/21/2015   ALT 20 07/19/2016   AST 19 07/19/2016   NA 136 07/19/2016   K 3.8 07/19/2016   CL 99 07/19/2016   CREATININE 1.45 07/19/2016   BUN 14 07/19/2016   CO2 31 07/19/2016   TSH 1.60 06/21/2015    No results found.  Assessment & Plan:   Steven Bauer was seen today for otitis media, uri and allergic rhinitis .  Diagnoses and all orders for this visit:  Eustachian tube dysfunction, right -     cetirizine-pseudoephedrine (ZYRTEC-D ALLERGY & CONGESTION) 5-120 MG tablet; Take 1 tablet by mouth 2 (two) times daily. -     fluticasone (FLONASE) 50 MCG/ACT nasal spray; Place 2 sprays into both nostrils daily.  Acute seasonal allergic rhinitis due to pollen -     cetirizine-pseudoephedrine (ZYRTEC-D ALLERGY & CONGESTION) 5-120 MG tablet; Take 1 tablet by mouth 2 (two) times daily. -     fluticasone (FLONASE) 50 MCG/ACT nasal spray; Place 2 sprays into both nostrils daily.  Right acute  serous otitis media, recurrence not specified -     amoxicillin-clavulanate (AUGMENTIN) 875-125 MG tablet; Take 1 tablet by mouth 2 (two) times daily. -     HYDROcodone-acetaminophen (NORCO/VICODIN) 5-325 MG tablet; Take 1 tablet by mouth every 6 (six) hours as needed for moderate pain.   I have discontinued Mr. Myles GipSessoms's cefdinir. I am also having him start on amoxicillin-clavulanate, cetirizine-pseudoephedrine, fluticasone, and HYDROcodone-acetaminophen. Additionally, I am having him maintain his eszopiclone, EPINEPHrine, tadalafil, aspirin EC, escitalopram, traMADol, testosterone cypionate, and emtricitabine-tenofovir.  Meds ordered this encounter  Medications  . amoxicillin-clavulanate (AUGMENTIN) 875-125 MG tablet    Sig: Take 1 tablet by mouth 2 (two) times daily.    Dispense:  20 tablet    Refill:  0  . cetirizine-pseudoephedrine (ZYRTEC-D ALLERGY & CONGESTION) 5-120 MG tablet    Sig: Take 1 tablet by mouth 2 (two) times daily.    Dispense:  180 tablet    Refill:  1  . fluticasone (FLONASE) 50 MCG/ACT  nasal spray    Sig: Place 2 sprays into both nostrils daily.    Dispense:  16 g    Refill:  11  . HYDROcodone-acetaminophen (NORCO/VICODIN) 5-325 MG tablet    Sig: Take 1 tablet by mouth every 6 (six) hours as needed for moderate pain.    Dispense:  15 tablet    Refill:  0     Follow-up: Return in about 2 weeks (around 10/24/2016).  Sanda Linger, MD

## 2016-10-10 NOTE — Progress Notes (Signed)
Pre visit review using our clinic review tool, if applicable. No additional management support is needed unless otherwise documented below in the visit note. 

## 2016-10-10 NOTE — Telephone Encounter (Signed)
Patient called requesting to be worked in with Dr. Yetta BarreJones today.

## 2016-10-21 ENCOUNTER — Other Ambulatory Visit: Payer: Self-pay | Admitting: Internal Medicine

## 2016-10-21 DIAGNOSIS — A6001 Herpesviral infection of penis: Secondary | ICD-10-CM

## 2016-10-29 ENCOUNTER — Other Ambulatory Visit: Payer: Self-pay | Admitting: Internal Medicine

## 2016-10-29 ENCOUNTER — Encounter: Payer: Self-pay | Admitting: Internal Medicine

## 2016-10-29 DIAGNOSIS — J0101 Acute recurrent maxillary sinusitis: Secondary | ICD-10-CM | POA: Insufficient documentation

## 2016-10-29 DIAGNOSIS — H6981 Other specified disorders of Eustachian tube, right ear: Secondary | ICD-10-CM

## 2016-10-29 DIAGNOSIS — J0100 Acute maxillary sinusitis, unspecified: Secondary | ICD-10-CM

## 2016-10-29 MED ORDER — DOXYCYCLINE MONOHYDRATE 100 MG PO CAPS: 100.0000 mg | ORAL_CAPSULE | Freq: Two times a day (BID) | ORAL | 0 refills | Status: DC

## 2016-10-29 MED ORDER — METHYLPREDNISOLONE 4 MG PO TBPK: ORAL_TABLET | ORAL | 0 refills | Status: DC

## 2016-11-17 ENCOUNTER — Other Ambulatory Visit: Payer: Self-pay | Admitting: Internal Medicine

## 2016-12-02 ENCOUNTER — Other Ambulatory Visit: Payer: Self-pay | Admitting: Internal Medicine

## 2016-12-02 DIAGNOSIS — J0100 Acute maxillary sinusitis, unspecified: Secondary | ICD-10-CM

## 2016-12-02 DIAGNOSIS — H6981 Other specified disorders of Eustachian tube, right ear: Secondary | ICD-10-CM

## 2016-12-03 ENCOUNTER — Encounter: Payer: Self-pay | Admitting: Internal Medicine

## 2016-12-11 ENCOUNTER — Encounter: Payer: Self-pay | Admitting: Internal Medicine

## 2016-12-11 DIAGNOSIS — H6981 Other specified disorders of Eustachian tube, right ear: Secondary | ICD-10-CM

## 2016-12-18 ENCOUNTER — Encounter: Payer: Self-pay | Admitting: Internal Medicine

## 2017-02-07 ENCOUNTER — Encounter: Payer: Self-pay | Admitting: Internal Medicine

## 2017-02-12 ENCOUNTER — Other Ambulatory Visit: Payer: Self-pay | Admitting: Internal Medicine

## 2017-02-12 DIAGNOSIS — Z720 Tobacco use: Secondary | ICD-10-CM | POA: Insufficient documentation

## 2017-02-12 MED ORDER — VARENICLINE TARTRATE 1 MG PO TABS: 1.0000 mg | ORAL_TABLET | Freq: Two times a day (BID) | ORAL | 3 refills | Status: DC

## 2017-02-12 MED ORDER — BUPROPION HCL ER (XL) 150 MG PO TB24: 150.0000 mg | ORAL_TABLET | Freq: Every day | ORAL | 1 refills | Status: DC

## 2017-02-12 MED ORDER — VARENICLINE TARTRATE 0.5 MG X 11 & 1 MG X 42 PO MISC: ORAL | 0 refills | Status: DC

## 2017-02-26 ENCOUNTER — Other Ambulatory Visit: Payer: Self-pay | Admitting: Internal Medicine

## 2017-02-26 ENCOUNTER — Encounter: Payer: Self-pay | Admitting: Internal Medicine

## 2017-02-26 DIAGNOSIS — J0100 Acute maxillary sinusitis, unspecified: Secondary | ICD-10-CM

## 2017-02-26 MED ORDER — CEFDINIR 300 MG PO CAPS: 300.0000 mg | ORAL_CAPSULE | Freq: Two times a day (BID) | ORAL | 0 refills | Status: AC

## 2017-03-25 ENCOUNTER — Other Ambulatory Visit: Payer: Self-pay | Admitting: Internal Medicine

## 2017-04-18 ENCOUNTER — Other Ambulatory Visit: Payer: Self-pay | Admitting: Internal Medicine

## 2017-04-18 ENCOUNTER — Ambulatory Visit (INDEPENDENT_AMBULATORY_CARE_PROVIDER_SITE_OTHER): Payer: Managed Care, Other (non HMO) | Admitting: Internal Medicine

## 2017-04-18 ENCOUNTER — Encounter: Payer: Self-pay | Admitting: Internal Medicine

## 2017-04-18 ENCOUNTER — Other Ambulatory Visit (INDEPENDENT_AMBULATORY_CARE_PROVIDER_SITE_OTHER): Payer: Managed Care, Other (non HMO)

## 2017-04-18 VITALS — BP 128/64 | HR 92 | Temp 98.2°F | Resp 16 | Ht 72.0 in | Wt 220.0 lb

## 2017-04-18 DIAGNOSIS — E291 Testicular hypofunction: Secondary | ICD-10-CM | POA: Diagnosis not present

## 2017-04-18 DIAGNOSIS — R61 Generalized hyperhidrosis: Secondary | ICD-10-CM | POA: Diagnosis not present

## 2017-04-18 DIAGNOSIS — E781 Pure hyperglyceridemia: Secondary | ICD-10-CM

## 2017-04-18 DIAGNOSIS — N183 Chronic kidney disease, stage 3 unspecified: Secondary | ICD-10-CM

## 2017-04-18 DIAGNOSIS — Z7251 High risk heterosexual behavior: Secondary | ICD-10-CM | POA: Diagnosis not present

## 2017-04-18 DIAGNOSIS — F5101 Primary insomnia: Secondary | ICD-10-CM | POA: Diagnosis not present

## 2017-04-18 DIAGNOSIS — R5383 Other fatigue: Secondary | ICD-10-CM | POA: Insufficient documentation

## 2017-04-18 LAB — CBC WITH DIFFERENTIAL/PLATELET
BASOS PCT: 0.8 % (ref 0.0–3.0)
Basophils Absolute: 0.1 10*3/uL (ref 0.0–0.1)
EOS ABS: 0.2 10*3/uL (ref 0.0–0.7)
EOS PCT: 2.2 % (ref 0.0–5.0)
HEMATOCRIT: 51 % (ref 39.0–52.0)
HEMOGLOBIN: 17.2 g/dL — AB (ref 13.0–17.0)
LYMPHS PCT: 27.7 % (ref 12.0–46.0)
Lymphs Abs: 2.1 10*3/uL (ref 0.7–4.0)
MCHC: 33.7 g/dL (ref 30.0–36.0)
MCV: 93 fl (ref 78.0–100.0)
Monocytes Absolute: 0.7 10*3/uL (ref 0.1–1.0)
Monocytes Relative: 9.2 % (ref 3.0–12.0)
NEUTROS ABS: 4.6 10*3/uL (ref 1.4–7.7)
Neutrophils Relative %: 60.1 % (ref 43.0–77.0)
PLATELETS: 297 10*3/uL (ref 150.0–400.0)
RBC: 5.49 Mil/uL (ref 4.22–5.81)
RDW: 13.6 % (ref 11.5–15.5)
WBC: 7.7 10*3/uL (ref 4.0–10.5)

## 2017-04-18 LAB — COMPREHENSIVE METABOLIC PANEL
ALBUMIN: 4.2 g/dL (ref 3.5–5.2)
ALT: 26 U/L (ref 0–53)
AST: 25 U/L (ref 0–37)
Alkaline Phosphatase: 69 U/L (ref 39–117)
BUN: 14 mg/dL (ref 6–23)
CALCIUM: 9.2 mg/dL (ref 8.4–10.5)
CHLORIDE: 100 meq/L (ref 96–112)
CO2: 32 meq/L (ref 19–32)
CREATININE: 1.65 mg/dL — AB (ref 0.40–1.50)
GFR: 49.45 mL/min — AB (ref >60.00)
Glucose, Bld: 80 mg/dL (ref 70–99)
POTASSIUM: 4.3 meq/L (ref 3.5–5.1)
Sodium: 137 mEq/L (ref 135–145)
Total Bilirubin: 0.4 mg/dL (ref 0.2–1.2)
Total Protein: 6.6 g/dL (ref 6.0–8.3)

## 2017-04-18 LAB — URINALYSIS, ROUTINE W REFLEX MICROSCOPIC
Bilirubin Urine: NEGATIVE
HGB URINE DIPSTICK: NEGATIVE
KETONES UR: NEGATIVE
Leukocytes, UA: NEGATIVE
NITRITE: NEGATIVE
Specific Gravity, Urine: 1.01 (ref 1.000–1.030)
Total Protein, Urine: NEGATIVE
URINE GLUCOSE: NEGATIVE
Urobilinogen, UA: 0.2 (ref 0.0–1.0)
pH: 7.5 (ref 5.0–8.0)

## 2017-04-18 LAB — LIPID PANEL
CHOLESTEROL: 180 mg/dL (ref 0–200)
HDL: 34.2 mg/dL — ABNORMAL LOW (ref >39.00)
Total CHOL/HDL Ratio: 5
Triglycerides: 637 mg/dL — ABNORMAL HIGH (ref 0.0–149.0)

## 2017-04-18 LAB — LDL CHOLESTEROL, DIRECT: LDL DIRECT: 101 mg/dL

## 2017-04-18 MED ORDER — OMEGA-3-ACID ETHYL ESTERS 1 G PO CAPS: 2.0000 g | ORAL_CAPSULE | Freq: Two times a day (BID) | ORAL | 5 refills | Status: DC

## 2017-04-18 MED ORDER — SUVOREXANT 20 MG PO TABS: 1.0000 | ORAL_TABLET | Freq: Every evening | ORAL | 5 refills | Status: DC | PRN

## 2017-04-18 MED ORDER — PROPANTHELINE BROMIDE 15 MG PO TABS: 15.0000 mg | ORAL_TABLET | Freq: Every day | ORAL | 1 refills | Status: DC

## 2017-04-18 NOTE — Patient Instructions (Signed)

## 2017-04-18 NOTE — Progress Notes (Signed)
Subjective:  Patient ID: Steven Bauer, male    DOB: [birth year]  Age: 41 y.o. MRN: [id number]  CC: Hyperlipidemia   HPI [name] presents for f/up - He complains of persistent insomnia with difficulty falling asleep and frequent awakenings. He tells me that Alfonso Patten has helped but it gives him a bad taste in his mouth and he wants to try something different. He also complains of excessive sweating especially at night. It makes him feel like he has night sweats. He complains of fatigue but denies any changes in his weight.  Outpatient Medications Prior to Visit  Medication Sig Dispense Refill  . aspirin EC 325 MG tablet Take 325 mg by mouth daily.    Marland Kitchen buPROPion (WELLBUTRIN XL) 150 MG 24 hr tablet Take 1 tablet (150 mg total) by mouth daily. 90 tablet 1  . cetirizine-pseudoephedrine (ZYRTEC-D ALLERGY & CONGESTION) 5-120 MG tablet Take 1 tablet by mouth 2 (two) times daily. 180 tablet 1  . EPINEPHrine 0.3 mg/0.3 mL IJ SOAJ injection Inject 0.3 mLs (0.3 mg total) into the muscle once. 2 Device 11  . escitalopram (LEXAPRO) 10 MG tablet TAKE 1 TABLET (10 MG TOTAL) BY MOUTH AT BEDTIME. 90 tablet 1  . fluticasone (FLONASE) 50 MCG/ACT nasal spray Place 2 sprays into both nostrils daily. 16 g 11  . tadalafil (CIALIS) 10 MG tablet Take by mouth.    . traMADol (ULTRAM) 50 MG tablet Take 1 tablet (50 mg total) by mouth every 6 (six) hours as needed. 90 tablet 3  . TRUVADA 200-300 MG tablet TAKE 1 TABLET BY MOUTH DAILY 90 tablet 0  . valACYclovir (VALTREX) 1000 MG tablet TAKE 1 TABLET BY MOUTH EVERY DAY 90 tablet 1  . varenicline (CHANTIX CONTINUING MONTH PAK) 1 MG tablet Take 1 tablet (1 mg total) by mouth 2 (two) times daily. 60 tablet 3  . varenicline (CHANTIX STARTING MONTH PAK) 0.5 MG X 11 & 1 MG X 42 tablet Take one 0.5 mg tablet by mouth once daily for 3 days, then increase to one 0.5 mg tablet twice daily for 4 days, then increase to one 1 mg tablet twice daily. 53 tablet 0  . eszopiclone  (LUNESTA) 2 MG TABS tablet Take 2 mg by mouth at bedtime as needed for sleep. Take immediately before bedtime    . testosterone cypionate (DEPOTESTOSTERONE CYPIONATE) 200 MG/ML injection INJECT 1 ML INTO THE MUSCLE EVERY 10 DAYS 10 mL 1   No facility-administered medications prior to visit.     ROS Review of Systems  Constitutional: Positive for fatigue. Negative for appetite change, chills, diaphoresis, fever and unexpected weight change.  HENT: Negative.  Negative for sinus pressure, sore throat, trouble swallowing and voice change.   Eyes: Negative.   Respiratory: Negative.  Negative for cough, chest tightness, shortness of breath and wheezing.   Cardiovascular: Negative for chest pain, palpitations and leg swelling.  Gastrointestinal: Negative for abdominal pain, constipation, diarrhea, nausea and vomiting.  Endocrine: Negative.   Genitourinary: Negative.  Negative for difficulty urinating, discharge, dysuria, penile pain, penile swelling, scrotal swelling, testicular pain and urgency.  Musculoskeletal: Negative.  Negative for back pain, myalgias and neck pain.  Skin: Negative.  Negative for color change and rash.  Allergic/Immunologic: Negative.   Neurological: Negative.  Negative for dizziness, weakness, numbness and headaches.  Hematological: Negative for adenopathy. Does not bruise/bleed easily.  Psychiatric/Behavioral: Positive for sleep disturbance. Negative for agitation, behavioral problems, decreased concentration, dysphoric mood and suicidal ideas. The patient is  not nervous/anxious.     Objective:  BP 128/64 (BP Location: Left Arm, Patient Position: Sitting, Cuff Size: Large)   Pulse 92   Temp 98.2 F (36.8 C) (Oral)   Resp 16   Ht 6' (1.829 m)   Wt 220 lb (99.8 kg)   SpO2 99%   BMI 29.84 kg/m   BP Readings from Last 3 Encounters:  04/18/17 128/64  10/10/16 (!) 148/86  07/19/16 132/80    Wt Readings from Last 3 Encounters:  04/18/17 220 lb (99.8 kg)    10/10/16 222 lb (100.7 kg)  07/19/16 209 lb 12 oz (95.1 kg)    Physical Exam  Constitutional: He is oriented to person, place, and time. No distress.  HENT:  Mouth/Throat: Oropharynx is clear and moist. No oropharyngeal exudate.  Eyes: Conjunctivae are normal. Right eye exhibits no discharge. Left eye exhibits no discharge. No scleral icterus.  Neck: Normal range of motion. Neck supple. No JVD present. No thyromegaly present.  Cardiovascular: Normal rate, regular rhythm and intact distal pulses.  Exam reveals no gallop and no friction rub.   No murmur heard. Pulmonary/Chest: Effort normal and breath sounds normal. No respiratory distress. He has no wheezes. He has no rales. He exhibits no tenderness.  Abdominal: Soft. Bowel sounds are normal. He exhibits no distension and no mass. There is no tenderness. There is no rebound and no guarding.  Musculoskeletal: Normal range of motion. He exhibits no edema, tenderness or deformity.  Lymphadenopathy:    He has no cervical adenopathy.  Neurological: He is alert and oriented to person, place, and time.  Skin: Skin is warm and dry. No rash noted. He is not diaphoretic. No erythema. No pallor.  Psychiatric: He has a normal mood and affect. His behavior is normal. Judgment and thought content normal.  Vitals reviewed.   Lab Results  Component Value Date   WBC 7.7 04/18/2017   HGB 17.2 (H) 04/18/2017   HCT 51.0 04/18/2017   PLT 297.0 04/18/2017   GLUCOSE 80 04/18/2017   CHOL 180 04/18/2017   TRIG (H) 04/18/2017    637.0 Triglyceride is over 400; calculations on Lipids are invalid.   HDL 34.20 (L) 04/18/2017   LDLDIRECT 101.0 04/18/2017   LDLCALC 107 (H) 06/21/2015   ALT 26 04/18/2017   AST 25 04/18/2017   NA 137 04/18/2017   K 4.3 04/18/2017   CL 100 04/18/2017   CREATININE 1.65 (H) 04/18/2017   BUN 14 04/18/2017   CO2 32 04/18/2017   TSH 1.18 04/18/2017    No results found.  Assessment & Plan:   Jeffree was seen today for  hyperlipidemia.  Diagnoses and all orders for this visit:  High risk sexual behavior- will screen for interval contraction of sexually transmitted infections. His creatinine clearance is >60 so he can continue Truvada. Safe sexual practices were reiterated. -     Cancel: GC/Chlamydia Probe Amp; Future -     HIV antibody; Future -     Hepatitis B surface antigen; Future -     RPR; Future -     GC/Chlamydia Probe Amp; Future  Pure hyperglyceridemia- history exam elevated at 635. In addition to lifestyle modifications of asked him to start taking low vase a to prevent complications such as pancreatitis. -     Lipid panel; Future -     omega-3 acid ethyl esters (LOVAZA) 1 g capsule; Take 2 capsules (2 g total) by mouth 2 (two) times daily.  Kidney disease, chronic, stage III (  GFR 30-59 ml/min)- his renal function is stable. He will continue to avoid nephrotoxic agents. -     CBC with Differential/Platelet; Future -     Comprehensive metabolic panel; Future -     Urinalysis, Routine w reflex microscopic; Future  Fatigue, unspecified type- his testosterone level is high so this is not a problem. He also has normal thyroid function. This is most likely related to chronic insomnia. -     Testosterone Total,Free,Bio, Males; Future -     Thyroid Panel With TSH; Future  Hypogonadism male- his testosterone level is a little above 1100. This is too high, I've asked him to decrease his dose from every 10 days to every 14 days. -     Testosterone Total,Free,Bio, Males; Future -     testosterone cypionate (DEPOTESTOSTERONE CYPIONATE) 200 MG/ML injection; Inject 1 mL (200 mg total) into the muscle every 14 (fourteen) days.  Primary insomnia -     Suvorexant (BELSOMRA) 20 MG TABS; Take 1 tablet by mouth at bedtime as needed.  Chronic nocturnal hyperhidrosis -     propantheline (PROBANTHINE) 15 MG tablet; Take 1 tablet (15 mg total) by mouth at bedtime.   I have discontinued Mr. Stclair  eszopiclone. I have also changed his testosterone cypionate. Additionally, I am having him start on Suvorexant, propantheline, and omega-3 acid ethyl esters. Lastly, I am having him maintain his EPINEPHrine, tadalafil, aspirin EC, traMADol, cetirizine-pseudoephedrine, fluticasone, valACYclovir, escitalopram, buPROPion, varenicline, varenicline, and TRUVADA.  Meds ordered this encounter  Medications  . Suvorexant (BELSOMRA) 20 MG TABS    Sig: Take 1 tablet by mouth at bedtime as needed.    Dispense:  30 tablet    Refill:  5  . propantheline (PROBANTHINE) 15 MG tablet    Sig: Take 1 tablet (15 mg total) by mouth at bedtime.    Dispense:  90 tablet    Refill:  1  . omega-3 acid ethyl esters (LOVAZA) 1 g capsule    Sig: Take 2 capsules (2 g total) by mouth 2 (two) times daily.    Dispense:  120 capsule    Refill:  5  . testosterone cypionate (DEPOTESTOSTERONE CYPIONATE) 200 MG/ML injection    Sig: Inject 1 mL (200 mg total) into the muscle every 14 (fourteen) days.    Dispense:  10 mL    Refill:  1    This request is for a new prescription for a controlled substance as required by Federal/State law.     Follow-up: Return in about 3 months (around 07/18/2017).  Sanda Linger, MD

## 2017-04-19 LAB — THYROID PANEL WITH TSH
FREE THYROXINE INDEX: 2.1 (ref 1.4–3.8)
T3 Uptake: 33 % (ref 22–35)
T4, Total: 6.3 ug/dL (ref 4.9–10.5)
TSH: 1.18 m[IU]/L (ref 0.40–4.50)

## 2017-04-19 LAB — TESTOSTERONE TOTAL,FREE,BIO, MALES
ALBUMIN MSPROF: 4.2 g/dL (ref 3.6–5.1)
Sex Hormone Binding: 7 nmol/L — ABNORMAL LOW (ref 10–50)
TESTOSTERONE BIOAVAILABLE: 916.6 ng/dL — AB (ref >=110.0)
TESTOSTERONE FREE: 475.9 pg/mL — AB (ref 46.0–224.0)
TESTOSTERONE: 1168 ng/dL — AB (ref 250–827)

## 2017-04-19 LAB — HIV ANTIBODY (ROUTINE TESTING W REFLEX): HIV 1&2 Ab, 4th Generation: NONREACTIVE

## 2017-04-19 LAB — RPR: RPR: NONREACTIVE

## 2017-04-19 LAB — HEPATITIS B SURFACE ANTIGEN: Hepatitis B Surface Ag: NONREACTIVE

## 2017-04-19 NOTE — Telephone Encounter (Signed)
rx faxed to the [facility] in [place]

## 2017-04-20 ENCOUNTER — Encounter: Payer: Self-pay | Admitting: Internal Medicine

## 2017-04-20 MED ORDER — TESTOSTERONE CYPIONATE 200 MG/ML IM SOLN: 200.0000 mg | INTRAMUSCULAR | 1 refills | Status: DC

## 2017-04-21 LAB — GC/CHLAMYDIA PROBE AMP
CHLAMYDIA, DNA PROBE: NEGATIVE
Neisseria gonorrhoeae by PCR: NEGATIVE

## 2017-04-22 ENCOUNTER — Encounter: Payer: Self-pay | Admitting: Internal Medicine

## 2017-04-22 NOTE — Telephone Encounter (Signed)
rx with new sig faxed to [facility] in [place].

## 2017-04-25 ENCOUNTER — Other Ambulatory Visit: Payer: Self-pay | Admitting: Internal Medicine

## 2017-04-25 ENCOUNTER — Encounter: Payer: Self-pay | Admitting: Internal Medicine

## 2017-04-25 DIAGNOSIS — F5101 Primary insomnia: Secondary | ICD-10-CM

## 2017-04-25 DIAGNOSIS — A6001 Herpesviral infection of penis: Secondary | ICD-10-CM

## 2017-04-25 MED ORDER — ZOLPIDEM TARTRATE ER 12.5 MG PO TBCR: 12.5000 mg | EXTENDED_RELEASE_TABLET | Freq: Every evening | ORAL | 1 refills | Status: DC | PRN

## 2017-06-08 ENCOUNTER — Other Ambulatory Visit: Payer: Self-pay | Admitting: Internal Medicine

## 2017-06-19 ENCOUNTER — Encounter: Payer: Self-pay | Admitting: Internal Medicine

## 2017-07-18 ENCOUNTER — Other Ambulatory Visit: Payer: Self-pay | Admitting: Internal Medicine

## 2017-07-18 DIAGNOSIS — F5101 Primary insomnia: Secondary | ICD-10-CM

## 2017-08-09 ENCOUNTER — Other Ambulatory Visit: Payer: Self-pay | Admitting: Internal Medicine

## 2017-08-09 DIAGNOSIS — Z720 Tobacco use: Secondary | ICD-10-CM

## 2017-08-19 ENCOUNTER — Other Ambulatory Visit: Payer: Self-pay | Admitting: Internal Medicine

## 2017-08-22 ENCOUNTER — Encounter: Payer: Self-pay | Admitting: Internal Medicine

## 2017-08-29 ENCOUNTER — Other Ambulatory Visit (INDEPENDENT_AMBULATORY_CARE_PROVIDER_SITE_OTHER): Payer: Managed Care, Other (non HMO)

## 2017-08-29 ENCOUNTER — Encounter: Payer: Self-pay | Admitting: Internal Medicine

## 2017-08-29 ENCOUNTER — Telehealth: Payer: Self-pay | Admitting: *Deleted

## 2017-08-29 ENCOUNTER — Ambulatory Visit: Payer: Managed Care, Other (non HMO) | Admitting: Internal Medicine

## 2017-08-29 VITALS — BP 134/80 | HR 85 | Temp 99.2°F | Resp 16 | Ht 72.0 in | Wt 224.0 lb

## 2017-08-29 DIAGNOSIS — J302 Other seasonal allergic rhinitis: Secondary | ICD-10-CM

## 2017-08-29 DIAGNOSIS — N183 Chronic kidney disease, stage 3 unspecified: Secondary | ICD-10-CM

## 2017-08-29 DIAGNOSIS — Z7252 High risk homosexual behavior: Secondary | ICD-10-CM

## 2017-08-29 DIAGNOSIS — J0101 Acute recurrent maxillary sinusitis: Secondary | ICD-10-CM | POA: Diagnosis not present

## 2017-08-29 DIAGNOSIS — E781 Pure hyperglyceridemia: Secondary | ICD-10-CM | POA: Diagnosis not present

## 2017-08-29 DIAGNOSIS — E291 Testicular hypofunction: Secondary | ICD-10-CM

## 2017-08-29 LAB — COMPREHENSIVE METABOLIC PANEL
ALBUMIN: 4.5 g/dL (ref 3.5–5.2)
ALT: 30 U/L (ref 0–53)
AST: 24 U/L (ref 0–37)
Alkaline Phosphatase: 71 U/L (ref 39–117)
BUN: 17 mg/dL (ref 6–23)
CHLORIDE: 97 meq/L (ref 96–112)
CO2: 31 meq/L (ref 19–32)
CREATININE: 1.67 mg/dL — AB (ref 0.40–1.50)
Calcium: 9.2 mg/dL (ref 8.4–10.5)
GFR: 48.68 mL/min — ABNORMAL LOW (ref >60.00)
GLUCOSE: 92 mg/dL (ref 70–99)
POTASSIUM: 3.7 meq/L (ref 3.5–5.1)
Sodium: 133 mEq/L — ABNORMAL LOW (ref 135–145)
Total Bilirubin: 0.4 mg/dL (ref 0.2–1.2)
Total Protein: 7.4 g/dL (ref 6.0–8.3)

## 2017-08-29 LAB — URINALYSIS, ROUTINE W REFLEX MICROSCOPIC
Bilirubin Urine: NEGATIVE
HGB URINE DIPSTICK: NEGATIVE
Ketones, ur: NEGATIVE
LEUKOCYTES UA: NEGATIVE
Nitrite: NEGATIVE
Specific Gravity, Urine: 1.01 (ref 1.000–1.030)
Total Protein, Urine: NEGATIVE
URINE GLUCOSE: NEGATIVE
Urobilinogen, UA: 0.2 (ref 0.0–1.0)
pH: 7 (ref 5.0–8.0)

## 2017-08-29 LAB — LIPID PANEL
CHOL/HDL RATIO: 8
CHOLESTEROL: 225 mg/dL — AB (ref 0–200)
HDL: 28.7 mg/dL — ABNORMAL LOW (ref >39.00)

## 2017-08-29 LAB — CBC WITH DIFFERENTIAL/PLATELET
BASOS PCT: 0.6 % (ref 0.0–3.0)
Basophils Absolute: 0.1 10*3/uL (ref 0.0–0.1)
EOS ABS: 0.3 10*3/uL (ref 0.0–0.7)
Eosinophils Relative: 3.7 % (ref 0.0–5.0)
HCT: 52.4 % — ABNORMAL HIGH (ref 39.0–52.0)
LYMPHS ABS: 2.8 10*3/uL (ref 0.7–4.0)
Lymphocytes Relative: 32.5 % (ref 12.0–46.0)
MCHC: 34.5 g/dL (ref 30.0–36.0)
MCV: 91.4 fl (ref 78.0–100.0)
Monocytes Absolute: 0.6 10*3/uL (ref 0.1–1.0)
Monocytes Relative: 7.5 % (ref 3.0–12.0)
NEUTROS PCT: 55.7 % (ref 43.0–77.0)
Neutro Abs: 4.8 10*3/uL (ref 1.4–7.7)
Platelets: 286 10*3/uL (ref 150.0–400.0)
RBC: 5.73 Mil/uL (ref 4.22–5.81)
RDW: 13.7 % (ref 11.5–15.5)
WBC: 8.6 10*3/uL (ref 4.0–10.5)

## 2017-08-29 LAB — LDL CHOLESTEROL, DIRECT: LDL DIRECT: 140 mg/dL

## 2017-08-29 MED ORDER — METHYLPREDNISOLONE 4 MG PO TBPK: ORAL_TABLET | ORAL | 0 refills | Status: AC

## 2017-08-29 MED ORDER — OMEGA-3-ACID ETHYL ESTERS 1 G PO CAPS: 2.0000 g | ORAL_CAPSULE | Freq: Two times a day (BID) | ORAL | 1 refills | Status: DC

## 2017-08-29 MED ORDER — CLINDAMYCIN HCL 300 MG PO CAPS: 300.0000 mg | ORAL_CAPSULE | Freq: Three times a day (TID) | ORAL | 0 refills | Status: AC

## 2017-08-29 NOTE — Patient Instructions (Signed)

## 2017-08-29 NOTE — Progress Notes (Signed)
Subjective:  Patient ID: Steven Bauer, male    DOB: October 07, 1975  Age: 42 y.o. MRN: [id number]  CC: Sinusitis   HPI Steven Bauer presents for a 2-week history of facial pain and pressure, nasal congestion, low-grade fever, nasal phlegm that is dark yellow and thick, postnasal drip, and postnasal drip.  He is tried to control his symptoms with Zyrtec and Flonase without much relief.  He denies cough, night sweats, lymphadenopathy, rash, abdominal pain, dysuria, or hematuria.  Outpatient Medications Prior to Visit  Medication Sig Dispense Refill  . aspirin EC 325 MG tablet Take 325 mg by mouth daily.    Marland Kitchen buPROPion (WELLBUTRIN XL) 150 MG 24 hr tablet TAKE 1 TABLET BY MOUTH EVERY DAY 90 tablet 1  . cetirizine-pseudoephedrine (ZYRTEC-D ALLERGY & CONGESTION) 5-120 MG tablet Take 1 tablet by mouth 2 (two) times daily. 180 tablet 1  . EPINEPHrine 0.3 mg/0.3 mL IJ SOAJ injection Inject 0.3 mLs (0.3 mg total) into the muscle once. 2 Device 11  . escitalopram (LEXAPRO) 10 MG tablet TAKE 1 TABLET (10 MG TOTAL) BY MOUTH AT BEDTIME. 90 tablet 1  . fluticasone (FLONASE) 50 MCG/ACT nasal spray Place 2 sprays into both nostrils daily. 16 g 11  . propantheline (PROBANTHINE) 15 MG tablet Take 1 tablet (15 mg total) by mouth at bedtime. 90 tablet 1  . tadalafil (CIALIS) 10 MG tablet Take by mouth.    . valACYclovir (VALTREX) 1000 MG tablet TAKE 1 TABLET BY MOUTH EVERY DAY 90 tablet 1  . zolpidem (AMBIEN CR) 12.5 MG CR tablet TAKE 1 TABLET BY MOUTH AT BEDTIME AS NEEDED FOR SLEEP 30 tablet 1  . omega-3 acid ethyl esters (LOVAZA) 1 g capsule Take 2 capsules (2 g total) by mouth 2 (two) times daily. 120 capsule 5  . testosterone cypionate (DEPOTESTOSTERONE CYPIONATE) 200 MG/ML injection Inject 1 mL (200 mg total) into the muscle every 14 (fourteen) days. 10 mL 1  . traMADol (ULTRAM) 50 MG tablet Take 1 tablet (50 mg total) by mouth every 6 (six) hours as needed. 90 tablet 3  . TRUVADA 200-300 MG tablet TAKE 1  TABLET BY MOUTH DAILY 90 tablet 0  . varenicline (CHANTIX CONTINUING MONTH PAK) 1 MG tablet Take 1 tablet (1 mg total) by mouth 2 (two) times daily. 60 tablet 3  . varenicline (CHANTIX STARTING MONTH PAK) 0.5 MG X 11 & 1 MG X 42 tablet Take one 0.5 mg tablet by mouth once daily for 3 days, then increase to one 0.5 mg tablet twice daily for 4 days, then increase to one 1 mg tablet twice daily. 53 tablet 0   No facility-administered medications prior to visit.     ROS Review of Systems  Constitutional: Positive for fever. Negative for activity change, appetite change, chills and fatigue.  HENT: Positive for congestion, postnasal drip, rhinorrhea, sinus pressure and sinus pain. Negative for ear pain, facial swelling, nosebleeds, sneezing, sore throat and trouble swallowing.   Eyes: Negative.   Respiratory: Negative for cough, chest tightness, shortness of breath and wheezing.   Cardiovascular: Negative for chest pain, palpitations and leg swelling.  Gastrointestinal: Negative for abdominal pain, constipation, diarrhea, nausea and vomiting.  Endocrine: Negative.   Genitourinary: Negative.  Negative for difficulty urinating, dysuria, genital sores, hematuria, penile swelling, scrotal swelling, testicular pain and urgency.  Musculoskeletal: Negative.  Negative for arthralgias and myalgias.  Skin: Negative.  Negative for color change and rash.  Neurological: Negative.  Negative for dizziness, weakness and headaches.  Hematological: Negative for adenopathy. Does not bruise/bleed easily.  Psychiatric/Behavioral: Positive for sleep disturbance. Negative for behavioral problems, confusion, decreased concentration, dysphoric mood, self-injury and suicidal ideas. The patient is not nervous/anxious.     Objective:  BP 134/80 (BP Location: Left Arm, Patient Position: Sitting, Cuff Size: Large)   Pulse 85   Temp 99.2 F (37.3 C) (Oral)   Resp 16   Ht 6' (1.829 m)   Wt 224 lb (101.6 kg)   SpO2 99%    BMI 30.38 kg/m   BP Readings from Last 3 Encounters:  08/29/17 134/80  04/18/17 128/64  10/10/16 (!) 148/86    Wt Readings from Last 3 Encounters:  08/29/17 224 lb (101.6 kg)  04/18/17 220 lb (99.8 kg)  10/10/16 222 lb (100.7 kg)    Physical Exam  Constitutional: He is oriented to person, place, and time.  Non-toxic appearance. He does not have a sickly appearance. He does not appear ill. No distress.  HENT:  Right Ear: Hearing, tympanic membrane, external ear and ear canal normal.  Left Ear: Hearing, tympanic membrane, external ear and ear canal normal.  Nose: Mucosal edema and rhinorrhea present. No epistaxis.  No foreign bodies. Right sinus exhibits maxillary sinus tenderness. Right sinus exhibits no frontal sinus tenderness. Left sinus exhibits maxillary sinus tenderness. Left sinus exhibits no frontal sinus tenderness.  Mouth/Throat: Oropharynx is clear and moist and mucous membranes are normal. Mucous membranes are not pale, not dry and not cyanotic. No oral lesions. No trismus in the jaw. No uvula swelling. No oropharyngeal exudate, posterior oropharyngeal edema or posterior oropharyngeal erythema.  Eyes: Conjunctivae are normal. No scleral icterus.  Neck: Normal range of motion. Neck supple. No JVD present. No thyromegaly present.  Cardiovascular: Normal rate, regular rhythm and normal heart sounds. Exam reveals no gallop.  No murmur heard. Pulmonary/Chest: Effort normal and breath sounds normal. No respiratory distress. He has no wheezes. He has no rales.  Abdominal: Soft. Bowel sounds are normal. He exhibits no distension and no mass. There is no tenderness. There is no guarding.  Musculoskeletal: Normal range of motion. He exhibits no edema, tenderness or deformity.  Lymphadenopathy:    He has no cervical adenopathy.  Neurological: He is alert and oriented to person, place, and time.  Skin: Skin is warm and dry. No rash noted. He is not diaphoretic. No erythema. No pallor.    Vitals reviewed.   Lab Results  Component Value Date   WBC 8.6 08/29/2017   HGB 18.1 Repeated and verified X2. (HH) 08/29/2017   HCT 52.4 (H) 08/29/2017   PLT 286.0 08/29/2017   GLUCOSE 92 08/29/2017   CHOL 225 (H) 08/29/2017   TRIG (H) 08/29/2017    831.0 Triglyceride is over 400; calculations on Lipids are invalid.   HDL 28.70 (L) 08/29/2017   LDLDIRECT 140.0 08/29/2017   LDLCALC 107 (H) 06/21/2015   ALT 30 08/29/2017   AST 24 08/29/2017   NA 133 (L) 08/29/2017   K 3.7 08/29/2017   CL 97 08/29/2017   CREATININE 1.67 (H) 08/29/2017   BUN 17 08/29/2017   CO2 31 08/29/2017   TSH 1.18 04/18/2017    No results found.  Assessment & Plan:   Steven Bauer was seen today for sinusitis.  Diagnoses and all orders for this visit:  Kidney disease, chronic, stage III (GFR 30-59 ml/min) (HCC)- His creatinine clearance is actually above 70. -     Comprehensive metabolic panel; Future -     CBC with Differential/Platelet; Future -  Urinalysis, Routine w reflex microscopic; Future  Hypogonadism male- His hemoglobin and hematocrit are elevated and his testosterone level is a little higher and I am comfortable with so I have asked him to decrease his dose to 100 mg every 2 weeks. -     CBC with Differential/Platelet; Future -     Testosterone Total,Free,Bio, Males; Future -     testosterone cypionate (DEPOTESTOSTERONE CYPIONATE) 200 MG/ML injection; Inject 0.5 mLs (100 mg total) into the muscle every 14 (fourteen) days.  High risk homosexual behavior- His creatinine clearance is above 70. Screening for HIV and hep B infection is negative.  He can continue taking Truvada. -     RPR; Future -     HIV antibody; Future -     Hepatitis B surface antigen; Future -     Hepatitis A antibody, total; Future -     Hepatitis B core antibody, total; Future -     Hepatitis B surface antibody; Future -     Hepatitis C antibody; Future -     emtricitabine-tenofovir (TRUVADA) 200-300 MG tablet; Take 1  tablet by mouth daily.  Pure hyperglyceridemia- He now informs me that he consumes a significant amount of alcohol.  This is likely the causative factor in causing his high triglycerides.  I have encouraged him to significantly decrease his alcohol intake and to improve his dietary intake of fats and carbs as well.  Will also start an omega-3 fish oil to lower the triglycerides and reduce complications such as pancreatitis. -     Lipid panel; Future -     omega-3 acid ethyl esters (LOVAZA) 1 g capsule; Take 2 capsules (2 g total) by mouth 2 (two) times daily.  Seasonal allergic rhinitis, unspecified trigger- He is having a significant flare.  Will treat with a course of systemic steroids. -     methylPREDNISolone (MEDROL DOSEPAK) 4 MG TBPK tablet; TAKE AS DIRECTED  Acute recurrent maxillary sinusitis- Will treat with a course of clindamycin. -     clindamycin (CLEOCIN) 300 MG capsule; Take 1 capsule (300 mg total) by mouth 3 (three) times daily for 10 days.   I have discontinued Daltyn Degroat. Febres's traMADol, varenicline, and varenicline. I have changed his TRUVADA to emtricitabine-tenofovir. I have also changed his testosterone cypionate. Additionally, I am having him start on clindamycin and methylPREDNISolone. Lastly, I am having him maintain his EPINEPHrine, tadalafil, aspirin EC, cetirizine-pseudoephedrine, fluticasone, propantheline, valACYclovir, escitalopram, zolpidem, buPROPion, and omega-3 acid ethyl esters.  Meds ordered this encounter  Medications  . clindamycin (CLEOCIN) 300 MG capsule    Sig: Take 1 capsule (300 mg total) by mouth 3 (three) times daily for 10 days.    Dispense:  30 capsule    Refill:  0  . methylPREDNISolone (MEDROL DOSEPAK) 4 MG TBPK tablet    Sig: TAKE AS DIRECTED    Dispense:  21 tablet    Refill:  0  . omega-3 acid ethyl esters (LOVAZA) 1 g capsule    Sig: Take 2 capsules (2 g total) by mouth 2 (two) times daily.    Dispense:  360 capsule    Refill:  1  .  emtricitabine-tenofovir (TRUVADA) 200-300 MG tablet    Sig: Take 1 tablet by mouth daily.    Dispense:  90 tablet    Refill:  0  . testosterone cypionate (DEPOTESTOSTERONE CYPIONATE) 200 MG/ML injection    Sig: Inject 0.5 mLs (100 mg total) into the muscle every 14 (fourteen) days.  Dispense:  10 mL    Refill:  0    This request is for a new prescription for a controlled substance as required by Federal/State law.     Follow-up: Return in about 3 weeks (around 09/19/2017).  Sanda Lingerhomas Jakeline Dave, MD

## 2017-08-30 ENCOUNTER — Encounter: Payer: Self-pay | Admitting: Internal Medicine

## 2017-08-30 LAB — HEPATITIS B SURFACE ANTIBODY,QUALITATIVE: HEP B S AB: REACTIVE — AB

## 2017-08-30 LAB — HEPATITIS B CORE ANTIBODY, TOTAL: HEP B C TOTAL AB: NONREACTIVE

## 2017-08-30 LAB — TESTOSTERONE TOTAL,FREE,BIO, MALES
Albumin: 4.6 g/dL (ref 3.6–5.1)
Sex Hormone Binding: 7 nmol/L — ABNORMAL LOW (ref 10–50)
TESTOSTERONE: 683 ng/dL (ref 250–827)
Testosterone, Bioavailable: 500.3 ng/dL (ref >=110.0)
Testosterone, Free: 238.3 pg/mL — ABNORMAL HIGH (ref 46.0–224.0)

## 2017-08-30 LAB — RPR: RPR Ser Ql: NONREACTIVE

## 2017-08-30 LAB — HEPATITIS A ANTIBODY, TOTAL: Hepatitis A AB,Total: NONREACTIVE

## 2017-08-30 LAB — HEPATITIS B SURFACE ANTIGEN: Hepatitis B Surface Ag: NONREACTIVE

## 2017-08-30 LAB — HEPATITIS C ANTIBODY
Hepatitis C Ab: NONREACTIVE
SIGNAL TO CUT-OFF: 0.03 (ref <1.00)

## 2017-08-30 LAB — HIV ANTIBODY (ROUTINE TESTING W REFLEX): HIV: NONREACTIVE

## 2017-08-30 MED ORDER — EMTRICITABINE-TENOFOVIR DF 200-300 MG PO TABS: 1.0000 | ORAL_TABLET | Freq: Every day | ORAL | 0 refills | Status: DC

## 2017-08-30 NOTE — Telephone Encounter (Signed)
error 

## 2017-08-31 ENCOUNTER — Encounter: Payer: Self-pay | Admitting: Internal Medicine

## 2017-08-31 MED ORDER — TESTOSTERONE CYPIONATE 200 MG/ML IM SOLN: 100.0000 mg | INTRAMUSCULAR | 0 refills | Status: DC

## 2017-09-11 ENCOUNTER — Ambulatory Visit (INDEPENDENT_AMBULATORY_CARE_PROVIDER_SITE_OTHER): Payer: Managed Care, Other (non HMO) | Admitting: *Deleted

## 2017-09-11 ENCOUNTER — Ambulatory Visit: Payer: Self-pay | Admitting: Internal Medicine

## 2017-09-11 DIAGNOSIS — Z23 Encounter for immunization: Secondary | ICD-10-CM | POA: Diagnosis not present

## 2017-09-11 NOTE — Progress Notes (Signed)
Pt was actually scheduled to get a Hep A. Office is out of vaccine so MD stated he can have the twinrx (Hep A/B). Inform pt the issue and he was agreeable to received the twinrx. Pt has made appt for next injection for 1 month, and last injection in 6 months...Raechel Chute/lmb

## 2017-10-01 ENCOUNTER — Encounter: Payer: Self-pay | Admitting: Internal Medicine

## 2017-10-01 ENCOUNTER — Other Ambulatory Visit (INDEPENDENT_AMBULATORY_CARE_PROVIDER_SITE_OTHER): Payer: Managed Care, Other (non HMO)

## 2017-10-01 ENCOUNTER — Ambulatory Visit: Payer: Managed Care, Other (non HMO) | Admitting: Internal Medicine

## 2017-10-01 VITALS — BP 118/90 | HR 86 | Temp 98.7°F | Resp 16 | Ht 72.0 in | Wt 222.0 lb

## 2017-10-01 DIAGNOSIS — J320 Chronic maxillary sinusitis: Secondary | ICD-10-CM

## 2017-10-01 DIAGNOSIS — J0101 Acute recurrent maxillary sinusitis: Secondary | ICD-10-CM | POA: Diagnosis not present

## 2017-10-01 DIAGNOSIS — E781 Pure hyperglyceridemia: Secondary | ICD-10-CM | POA: Diagnosis not present

## 2017-10-01 LAB — LIPID PANEL
CHOLESTEROL: 241 mg/dL — AB (ref 0–200)
HDL: 36.5 mg/dL — ABNORMAL LOW (ref >39.00)
NonHDL: 204.87
TRIGLYCERIDES: 343 mg/dL — AB (ref 0.0–149.0)
Total CHOL/HDL Ratio: 7
VLDL: 68.6 mg/dL — AB (ref 0.0–40.0)

## 2017-10-01 LAB — LDL CHOLESTEROL, DIRECT: Direct LDL: 161 mg/dL

## 2017-10-01 MED ORDER — AMOXICILLIN-POT CLAVULANATE 875-125 MG PO TABS: 1.0000 | ORAL_TABLET | Freq: Two times a day (BID) | ORAL | 0 refills | Status: AC

## 2017-10-01 NOTE — Patient Instructions (Signed)

## 2017-10-01 NOTE — Progress Notes (Signed)
Subjective:  Patient ID: Steven Bauer, male    DOB: 02/14/1976  Age: 42 y.o. MRN: [id number]  CC: Sinusitis   HPI Steven Bauer presents for a 3-day history of fever to 99.6 with sore throat, facial pressure, and thick yellow/green nasal phlegm.  He denies cough, headache, or hemoptysis.  He is frustrated that he keeps having sinus infections and he wants to see his ENT doctor again.  To lower his trigs he has significantly decreased his alcohol intake and he is taking the omega-3 fish oils.  Outpatient Medications Prior to Visit  Medication Sig Dispense Refill  . aspirin EC 325 MG tablet Take 325 mg by mouth daily.    Marland Kitchen. buPROPion (WELLBUTRIN XL) 150 MG 24 hr tablet TAKE 1 TABLET BY MOUTH EVERY DAY 90 tablet 1  . cetirizine-pseudoephedrine (ZYRTEC-D ALLERGY & CONGESTION) 5-120 MG tablet Take 1 tablet by mouth 2 (two) times daily. 180 tablet 1  . emtricitabine-tenofovir (TRUVADA) 200-300 MG tablet Take 1 tablet by mouth daily. 90 tablet 0  . EPINEPHrine 0.3 mg/0.3 mL IJ SOAJ injection Inject 0.3 mLs (0.3 mg total) into the muscle once. 2 Device 11  . escitalopram (LEXAPRO) 10 MG tablet TAKE 1 TABLET (10 MG TOTAL) BY MOUTH AT BEDTIME. 90 tablet 1  . fluticasone (FLONASE) 50 MCG/ACT nasal spray Place 2 sprays into both nostrils daily. 16 g 11  . omega-3 acid ethyl esters (LOVAZA) 1 g capsule Take 2 capsules (2 g total) by mouth 2 (two) times daily. 360 capsule 1  . propantheline (PROBANTHINE) 15 MG tablet Take 1 tablet (15 mg total) by mouth at bedtime. 90 tablet 1  . tadalafil (CIALIS) 10 MG tablet Take by mouth.    . testosterone cypionate (DEPOTESTOSTERONE CYPIONATE) 200 MG/ML injection Inject 0.5 mLs (100 mg total) into the muscle every 14 (fourteen) days. 10 mL 0  . valACYclovir (VALTREX) 1000 MG tablet TAKE 1 TABLET BY MOUTH EVERY DAY 90 tablet 1  . zolpidem (AMBIEN CR) 12.5 MG CR tablet TAKE 1 TABLET BY MOUTH AT BEDTIME AS NEEDED FOR SLEEP 30 tablet 1   No facility-administered  medications prior to visit.     ROS Review of Systems  Constitutional: Positive for chills and fever. Negative for diaphoresis and fatigue.  HENT: Positive for postnasal drip, rhinorrhea, sinus pressure, sinus pain and sore throat. Negative for congestion, facial swelling, nosebleeds, sneezing, trouble swallowing and voice change.   Eyes: Negative.   Respiratory: Negative.  Negative for cough, chest tightness, shortness of breath and wheezing.   Cardiovascular: Negative for chest pain, palpitations and leg swelling.  Gastrointestinal: Negative.  Negative for abdominal pain, constipation, diarrhea, nausea and vomiting.  Endocrine: Negative.   Genitourinary: Negative.  Negative for difficulty urinating.  Musculoskeletal: Negative.  Negative for back pain and myalgias.  Skin: Negative.  Negative for color change and pallor.  Allergic/Immunologic: Negative.   Neurological: Negative.  Negative for dizziness, weakness and numbness.  Hematological: Negative for adenopathy. Does not bruise/bleed easily.  Psychiatric/Behavioral: Negative.     Objective:  BP 118/90 (BP Location: Left Arm, Patient Position: Sitting, Cuff Size: Large)   Pulse 86   Temp 98.7 F (37.1 C) (Oral)   Resp 16   Ht 6' (1.829 m)   Wt 222 lb (100.7 kg)   SpO2 98%   BMI 30.11 kg/m   BP Readings from Last 3 Encounters:  10/01/17 118/90  08/29/17 134/80  04/18/17 128/64    Wt Readings from Last 3 Encounters:  10/01/17  222 lb (100.7 kg)  08/29/17 224 lb (101.6 kg)  04/18/17 220 lb (99.8 kg)    Physical Exam  Constitutional: He is oriented to person, place, and time.  Non-toxic appearance. He does not have a sickly appearance. He does not appear ill. No distress.  HENT:  Right Ear: Tympanic membrane normal.  Left Ear: Tympanic membrane normal.  Nose: Rhinorrhea present. No mucosal edema or nasal deformity. No epistaxis. Right sinus exhibits maxillary sinus tenderness. Right sinus exhibits no frontal sinus  tenderness. Left sinus exhibits maxillary sinus tenderness. Left sinus exhibits no frontal sinus tenderness.  Mouth/Throat: Oropharynx is clear and moist. No oropharyngeal exudate.  Eyes: Conjunctivae are normal. Left eye exhibits no discharge. No scleral icterus.  Neck: Normal range of motion. Neck supple. No JVD present. No thyromegaly present.  Cardiovascular: Normal rate, regular rhythm and normal heart sounds.  No murmur heard. Pulmonary/Chest: Effort normal and breath sounds normal. No respiratory distress. He has no wheezes. He has no rales.  Abdominal: Soft. Bowel sounds are normal. He exhibits no distension and no mass. There is no tenderness. There is no guarding.  Musculoskeletal: Normal range of motion. He exhibits no edema, tenderness or deformity.  Lymphadenopathy:    He has no cervical adenopathy.  Neurological: He is alert and oriented to person, place, and time.  Skin: Skin is warm and dry. No rash noted. He is not diaphoretic. No erythema. No pallor.  Vitals reviewed.   Lab Results  Component Value Date   WBC 8.6 08/29/2017   HGB 18.1 Repeated and verified X2. (HH) 08/29/2017   HCT 52.4 (H) 08/29/2017   PLT 286.0 08/29/2017   GLUCOSE 92 08/29/2017   CHOL 241 (H) 10/01/2017   TRIG 343.0 (H) 10/01/2017   HDL 36.50 (L) 10/01/2017   LDLDIRECT 161.0 10/01/2017   LDLCALC 107 (H) 06/21/2015   ALT 30 08/29/2017   AST 24 08/29/2017   NA 133 (L) 08/29/2017   K 3.7 08/29/2017   CL 97 08/29/2017   CREATININE 1.67 (H) 08/29/2017   BUN 17 08/29/2017   CO2 31 08/29/2017   TSH 1.18 04/18/2017    No results found.  Assessment & Plan:   Steven Bauer was seen today for sinusitis.  Diagnoses and all orders for this visit:  Pure hyperglyceridemia- His triglycerides are down to 343.  He was praised for the improvement. -     Lipid panel; Future  Acute recurrent maxillary sinusitis- I will treat the infection with Augmentin. -     amoxicillin-clavulanate (AUGMENTIN) 875-125 MG  tablet; Take 1 tablet by mouth 2 (two) times daily for 14 days.  Chronic sinusitis of both maxillary sinuses -     Ambulatory referral to ENT   I am having Steven Bauer. Steven Bauer start on amoxicillin-clavulanate. I am also having him maintain his EPINEPHrine, tadalafil, aspirin EC, cetirizine-pseudoephedrine, fluticasone, propantheline, valACYclovir, escitalopram, zolpidem, buPROPion, omega-3 acid ethyl esters, emtricitabine-tenofovir, and testosterone cypionate.  Meds ordered this encounter  Medications  . amoxicillin-clavulanate (AUGMENTIN) 875-125 MG tablet    Sig: Take 1 tablet by mouth 2 (two) times daily for 14 days.    Dispense:  28 tablet    Refill:  0     Follow-up: Return if symptoms worsen or fail to improve.  Sanda Linger, MD

## 2017-10-02 ENCOUNTER — Encounter: Payer: Self-pay | Admitting: Internal Medicine

## 2017-10-09 ENCOUNTER — Ambulatory Visit (INDEPENDENT_AMBULATORY_CARE_PROVIDER_SITE_OTHER): Payer: Managed Care, Other (non HMO)

## 2017-10-09 DIAGNOSIS — Z23 Encounter for immunization: Secondary | ICD-10-CM

## 2017-10-10 ENCOUNTER — Other Ambulatory Visit: Payer: Self-pay | Admitting: Internal Medicine

## 2017-10-10 DIAGNOSIS — F5101 Primary insomnia: Secondary | ICD-10-CM

## 2017-10-10 DIAGNOSIS — R61 Generalized hyperhidrosis: Secondary | ICD-10-CM

## 2017-10-25 ENCOUNTER — Other Ambulatory Visit: Payer: Self-pay | Admitting: Internal Medicine

## 2017-10-25 DIAGNOSIS — Z7252 High risk homosexual behavior: Secondary | ICD-10-CM

## 2017-11-04 ENCOUNTER — Other Ambulatory Visit: Payer: Self-pay | Admitting: Internal Medicine

## 2017-11-04 ENCOUNTER — Encounter: Payer: Self-pay | Admitting: Internal Medicine

## 2017-11-04 DIAGNOSIS — J301 Allergic rhinitis due to pollen: Secondary | ICD-10-CM

## 2017-11-04 DIAGNOSIS — H6981 Other specified disorders of Eustachian tube, right ear: Secondary | ICD-10-CM

## 2017-11-04 DIAGNOSIS — E291 Testicular hypofunction: Secondary | ICD-10-CM

## 2017-11-04 MED ORDER — CETIRIZINE-PSEUDOEPHEDRINE ER 5-120 MG PO TB12: 1.0000 | ORAL_TABLET | Freq: Two times a day (BID) | ORAL | 1 refills | Status: DC

## 2017-11-21 ENCOUNTER — Ambulatory Visit: Payer: Managed Care, Other (non HMO) | Admitting: Internal Medicine

## 2017-12-03 ENCOUNTER — Other Ambulatory Visit: Payer: Self-pay | Admitting: Internal Medicine

## 2017-12-04 ENCOUNTER — Encounter: Payer: Self-pay | Admitting: Internal Medicine

## 2017-12-09 ENCOUNTER — Encounter: Payer: Self-pay | Admitting: Internal Medicine

## 2017-12-09 ENCOUNTER — Ambulatory Visit: Payer: Managed Care, Other (non HMO) | Admitting: Internal Medicine

## 2017-12-09 VITALS — BP 124/80 | HR 85 | Temp 98.0°F | Ht 72.0 in | Wt 228.0 lb

## 2017-12-09 DIAGNOSIS — R0989 Other specified symptoms and signs involving the circulatory and respiratory systems: Secondary | ICD-10-CM

## 2017-12-09 MED ORDER — NIFEDIPINE ER OSMOTIC RELEASE 30 MG PO TB24: 30.0000 mg | ORAL_TABLET | Freq: Every day | ORAL | 0 refills | Status: DC

## 2017-12-09 NOTE — Progress Notes (Addendum)
Subjective:    Patient ID: Steven Bauer, Steven [name] 17, 1977, 42 y.o.   MRN: [id number]  HPI  Here to f/u with c/o coolness and mild to mod discomforta constant with change of colors blue white red ongoing for several days after did a head first slide into the base while playing softball.  Has already had complicated left hand hx with surgury for 2 broken hand bones (he thinks third and fourth) tx with multiple screws.  Now had worsening diffuse pain to hand and the persistent third finger coolness and color changing.   No prior hx of same or vasculitis Past Medical History:  Diagnosis Date  . Chronic back pain   . HSV (herpes simplex virus) infection   . Low testosterone   . PAC (premature atrial contraction)   . PVC (premature ventricular contraction)    Past Surgical History:  Procedure Laterality Date  . OPEN REDUCTION INTERNAL FIXATION (ORIF) METACARPAL Left 03/24/2015   Procedure: OPEN REDUCTION INTERNAL FIXATION (ORIF) LONG AND RING METACARPAL FRACTURE;  Surgeon: Betha Loa, MD;  Location: [facility];  Service: Orthopedics;  Laterality: Left;  . TONSILLECTOMY  2012    reports that he has been smoking cigarettes.  He has been smoking about 0.50 packs per day. He quit smokeless tobacco use about 2 years ago. He reports that he drinks about 30.0 oz of alcohol per week. He reports that he does not use drugs. family history is not on file. He was adopted. Allergies  Allergen Reactions  . Lunesta [Eszopiclone] Other (See Comments)    Metallic taste  . Bee Venom   . Zithromax [Azithromycin] Rash   Current Outpatient Medications on File Prior to Visit  Medication Sig Dispense Refill  . aspirin EC 325 MG tablet Take 325 mg by mouth daily.    Marland Kitchen buPROPion (WELLBUTRIN XL) 150 MG 24 hr tablet TAKE 1 TABLET BY MOUTH EVERY DAY 90 tablet 1  . cetirizine-pseudoephedrine (ZYRTEC-D ALLERGY & CONGESTION) 5-120 MG tablet Take 1 tablet by mouth 2 (two) times daily. 180 tablet  1  . EPINEPHrine 0.3 mg/0.3 mL IJ SOAJ injection Inject 0.3 mLs (0.3 mg total) into the muscle once. 2 Device 11  . escitalopram (LEXAPRO) 10 MG tablet TAKE 1 TABLET (10 MG TOTAL) BY MOUTH AT BEDTIME. 90 tablet 1  . fluticasone (FLONASE) 50 MCG/ACT nasal spray Place 2 sprays into both nostrils daily. 16 g 11  . omega-3 acid ethyl esters (LOVAZA) 1 g capsule Take 2 capsules (2 g total) by mouth 2 (two) times daily. 360 capsule 1  . propantheline (PROBANTHINE) 15 MG tablet TAKE 1 TABLET BY MOUTH EVERYDAY AT BEDTIME 90 tablet 1  . tadalafil (CIALIS) 10 MG tablet Take by mouth.    . testosterone cypionate (DEPOTESTOSTERONE CYPIONATE) 200 MG/ML injection Inject 0.5 mLs (100 mg total) into the muscle every 14 (fourteen) days. 10 mL 0  . TRUVADA 200-300 MG tablet TAKE 1 TABLET BY MOUTH EVERY DAY 90 tablet 0  . valACYclovir (VALTREX) 1000 MG tablet TAKE 1 TABLET BY MOUTH EVERY DAY 90 tablet 1  . zolpidem (AMBIEN CR) 12.5 MG CR tablet TAKE 1 TABLET BY MOUTH EVERY DAY AT BEDTIME AS NEEDED FOR SLEEP 30 tablet 3   No current facility-administered medications on file prior to visit.    Review of Systems  Constitutional: Negative for other unusual diaphoresis or sweats HENT: Negative for ear discharge or swelling Eyes: Negative for other worsening visual disturbances Respiratory: Negative  for stridor or other swelling  Gastrointestinal: Negative for worsening distension or other blood Genitourinary: Negative for retention or other urinary change Musculoskeletal: Negative for other MSK pain or swelling Skin: Negative for color change or other new lesions Neurological: Negative for worsening tremors and other numbness  Psychiatric/Behavioral: Negative for worsening agitation or other fatigue All other system neg per pt    Objective:   Physical Exam BP 124/80   Pulse 85   Temp 98 F (36.7 C) (Oral)   Ht 6' (1.829 m)   Wt 228 lb (103.4 kg)   SpO2 96%   BMI 30.92 kg/m  VS noted,  Constitutional:  Pt appears in NAD HENT: Head: NCAT.  Right Ear: External ear normal.  Left Ear: External ear normal.  Eyes: . Pupils are equal, round, and reactive to light. Conjunctivae and EOM are normal Nose: without d/c or deformity Neck: Neck supple. Gross normal ROM Cardiovascular: Normal rate and regular rhythm.   Pulmonary/Chest: Effort normal and breath sounds without rales or wheezing.  Left dorsal hand with mild tender 3rd and 4th rays with good color 3rd finger only with diffuse coolness, general swelling and somewhat pale, o/w neurovasc intact, cappillary refill about 2 seconds Neurological: Pt is alert. At baseline orientation, motor grossly intact Skin: Skin is warm. No rashes, other new lesions, no LE edema Psychiatric: Pt behavior is normal without agitation        Assessment & Plan:

## 2017-12-09 NOTE — Patient Instructions (Signed)
Please take all new medication as prescribed - the medication to relax the blood vessels  You will be contacted regarding the referral for: Hand Surgury - Dr Jenne Pane  Please continue all other medications as before, and refills have been done if requested.  Please have the pharmacy call with any other refills you may need.  Please keep your appointments with your specialists as you may have planned

## 2017-12-09 NOTE — Assessment & Plan Note (Signed)
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

## 2017-12-10 NOTE — Telephone Encounter (Signed)
Corrie Dandy or Lawson Fiscal  - please see pt request for his hand surgury referral  Hey Dr Jonny Ruiz.  Correction on my hand surgeon. Should be Dr Merlyn Lot. [facility]. [phone]

## 2017-12-16 ENCOUNTER — Other Ambulatory Visit: Payer: Self-pay | Admitting: Internal Medicine

## 2017-12-16 DIAGNOSIS — E291 Testicular hypofunction: Secondary | ICD-10-CM

## 2018-01-01 ENCOUNTER — Other Ambulatory Visit: Payer: Self-pay | Admitting: Internal Medicine

## 2018-01-06 ENCOUNTER — Encounter: Payer: Self-pay | Admitting: Internal Medicine

## 2018-01-27 ENCOUNTER — Ambulatory Visit: Payer: Managed Care, Other (non HMO) | Admitting: Internal Medicine

## 2018-01-27 ENCOUNTER — Encounter: Payer: Self-pay | Admitting: Internal Medicine

## 2018-01-27 ENCOUNTER — Other Ambulatory Visit (INDEPENDENT_AMBULATORY_CARE_PROVIDER_SITE_OTHER): Payer: Managed Care, Other (non HMO)

## 2018-01-27 ENCOUNTER — Other Ambulatory Visit: Payer: Self-pay | Admitting: Internal Medicine

## 2018-01-27 VITALS — BP 122/72 | HR 84 | Temp 98.4°F | Resp 16 | Ht 72.0 in | Wt 227.0 lb

## 2018-01-27 DIAGNOSIS — R0683 Snoring: Secondary | ICD-10-CM | POA: Diagnosis not present

## 2018-01-27 DIAGNOSIS — R61 Generalized hyperhidrosis: Secondary | ICD-10-CM | POA: Diagnosis not present

## 2018-01-27 DIAGNOSIS — E291 Testicular hypofunction: Secondary | ICD-10-CM

## 2018-01-27 DIAGNOSIS — N5201 Erectile dysfunction due to arterial insufficiency: Secondary | ICD-10-CM | POA: Diagnosis not present

## 2018-01-27 DIAGNOSIS — Z7252 High risk homosexual behavior: Secondary | ICD-10-CM

## 2018-01-27 DIAGNOSIS — E781 Pure hyperglyceridemia: Secondary | ICD-10-CM

## 2018-01-27 DIAGNOSIS — R5383 Other fatigue: Secondary | ICD-10-CM

## 2018-01-27 DIAGNOSIS — Z87892 Personal history of anaphylaxis: Secondary | ICD-10-CM | POA: Insufficient documentation

## 2018-01-27 LAB — COMPREHENSIVE METABOLIC PANEL
ALK PHOS: 59 U/L (ref 39–117)
ALT: 27 U/L (ref 0–53)
AST: 23 U/L (ref 0–37)
Albumin: 4.2 g/dL (ref 3.5–5.2)
BILIRUBIN TOTAL: 0.3 mg/dL (ref 0.2–1.2)
BUN: 20 mg/dL (ref 6–23)
CO2: 29 mEq/L (ref 19–32)
Calcium: 9.2 mg/dL (ref 8.4–10.5)
Chloride: 103 mEq/L (ref 96–112)
Creatinine, Ser: 1.65 mg/dL — ABNORMAL HIGH (ref 0.40–1.50)
GFR: 49.25 mL/min — AB (ref >60.00)
GLUCOSE: 89 mg/dL (ref 70–99)
POTASSIUM: 4.2 meq/L (ref 3.5–5.1)
SODIUM: 137 meq/L (ref 135–145)
Total Protein: 6.7 g/dL (ref 6.0–8.3)

## 2018-01-27 LAB — CBC WITH DIFFERENTIAL/PLATELET
Basophils Absolute: 0.1 10*3/uL (ref 0.0–0.1)
Basophils Relative: 0.8 % (ref 0.0–3.0)
EOS PCT: 2.7 % (ref 0.0–5.0)
Eosinophils Absolute: 0.2 10*3/uL (ref 0.0–0.7)
HCT: 48.8 % (ref 39.0–52.0)
Hemoglobin: 16.9 g/dL (ref 13.0–17.0)
LYMPHS ABS: 2.4 10*3/uL (ref 0.7–4.0)
Lymphocytes Relative: 33.2 % (ref 12.0–46.0)
MCHC: 34.6 g/dL (ref 30.0–36.0)
MCV: 91.2 fl (ref 78.0–100.0)
MONO ABS: 0.6 10*3/uL (ref 0.1–1.0)
Monocytes Relative: 8.2 % (ref 3.0–12.0)
Neutro Abs: 4 10*3/uL (ref 1.4–7.7)
Neutrophils Relative %: 55.1 % (ref 43.0–77.0)
Platelets: 246 10*3/uL (ref 150.0–400.0)
RBC: 5.35 Mil/uL (ref 4.22–5.81)
RDW: 12.9 % (ref 11.5–15.5)
WBC: 7.2 10*3/uL (ref 4.0–10.5)

## 2018-01-27 LAB — TRIGLYCERIDES

## 2018-01-27 LAB — VITAMIN D 25 HYDROXY (VIT D DEFICIENCY, FRACTURES): VITD: 46.92 ng/mL (ref 30.00–100.00)

## 2018-01-27 LAB — VITAMIN B12: Vitamin B-12: 1360 pg/mL — ABNORMAL HIGH (ref 211–911)

## 2018-01-27 MED ORDER — TESTOSTERONE CYPIONATE 200 MG/ML IM SOLN: 200.0000 mg | INTRAMUSCULAR | 0 refills | Status: DC

## 2018-01-27 MED ORDER — AVANAFIL 100 MG PO TABS: 1.0000 | ORAL_TABLET | Freq: Every day | ORAL | 5 refills | Status: DC | PRN

## 2018-01-27 MED ORDER — EPINEPHRINE 0.3 MG/0.3ML IJ SOAJ: 0.3000 mg | Freq: Once | INTRAMUSCULAR | 11 refills | Status: AC

## 2018-01-27 NOTE — Telephone Encounter (Signed)
Check [place] registry last filled 12/16/2017.Marland Kitchen.Steven Bauer/lmb

## 2018-01-27 NOTE — Progress Notes (Signed)
Subjective:  Patient ID: Steven Bauer, male    DOB: Jan 31, 1976  Age: 42 y.o. MRN: [id number]  CC: Hyperlipidemia   HPI Steven Bauer presents for f/up - He has a history of high triglycerides.  He does not think he is taking the omega-3 fish oil. He is not sure why.  He complains of heavy snoring, nonrestorative sleep, heavy dreams, fatigue, and falling asleep easily during the day.  Outpatient Medications Prior to Visit  Medication Sig Dispense Refill  . buPROPion (WELLBUTRIN XL) 150 MG 24 hr tablet TAKE 1 TABLET BY MOUTH EVERY DAY 90 tablet 1  . escitalopram (LEXAPRO) 10 MG tablet TAKE 1 TABLET (10 MG TOTAL) BY MOUTH AT BEDTIME. 90 tablet 1  . fluticasone (FLONASE) 50 MCG/ACT nasal spray Place 2 sprays into both nostrils daily. 16 g 11  . propantheline (PROBANTHINE) 15 MG tablet TAKE 1 TABLET BY MOUTH EVERYDAY AT BEDTIME 90 tablet 1  . valACYclovir (VALTREX) 1000 MG tablet TAKE 1 TABLET BY MOUTH EVERY DAY 90 tablet 1  . zolpidem (AMBIEN CR) 12.5 MG CR tablet TAKE 1 TABLET BY MOUTH EVERY DAY AT BEDTIME AS NEEDED FOR SLEEP 30 tablet 3  . aspirin EC 325 MG tablet Take 325 mg by mouth daily.    . cetirizine-pseudoephedrine (ZYRTEC-D ALLERGY & CONGESTION) 5-120 MG tablet Take 1 tablet by mouth 2 (two) times daily. 180 tablet 1  . EPINEPHrine 0.3 mg/0.3 mL IJ SOAJ injection Inject 0.3 mLs (0.3 mg total) into the muscle once. 2 Device 11  . NIFEdipine (PROCARDIA-XL/ADALAT-CC/NIFEDICAL-XL) 30 MG 24 hr tablet TAKE 1 TABLET BY MOUTH EVERY DAY 30 tablet 1  . omega-3 acid ethyl esters (LOVAZA) 1 g capsule Take 2 capsules (2 g total) by mouth 2 (two) times daily. 360 capsule 1  . tadalafil (CIALIS) 10 MG tablet Take by mouth.    . testosterone cypionate (DEPOTESTOSTERONE CYPIONATE) 200 MG/ML injection INJECT 1 ML INTO THE MUSCLE EVERY 14 DAYS 10 mL 0  . TRUVADA 200-300 MG tablet TAKE 1 TABLET BY MOUTH EVERY DAY 90 tablet 0   No facility-administered medications prior to visit.     ROS Review  of Systems  Constitutional: Positive for fatigue. Negative for appetite change, diaphoresis and unexpected weight change.  HENT: Negative.   Eyes: Negative for visual disturbance.  Respiratory: Negative for apnea, cough, chest tightness, shortness of breath and wheezing.        +heavy snoring  Cardiovascular: Negative for chest pain and leg swelling.  Gastrointestinal: Negative for abdominal pain, constipation, diarrhea, rectal pain and vomiting.  Genitourinary: Negative.  Negative for difficulty urinating, dysuria, genital sores, penile swelling, scrotal swelling, testicular pain and urgency.       +ED  Musculoskeletal: Negative.  Negative for arthralgias and myalgias.  Skin: Negative.  Negative for color change, pallor and rash.  Neurological: Negative.  Negative for dizziness, weakness and light-headedness.  Hematological: Negative for adenopathy. Does not bruise/bleed easily.  Psychiatric/Behavioral: Positive for sleep disturbance. Negative for confusion, decreased concentration, dysphoric mood and suicidal ideas. The patient is not nervous/anxious.     Objective:  BP 122/72 (BP Location: Left Arm, Patient Position: Sitting, Cuff Size: Large)   Pulse 84   Temp 98.4 F (36.9 C) (Oral)   Resp 16   Ht 6' (1.829 m)   Wt 227 lb (103 kg)   SpO2 98%   BMI 30.79 kg/m   BP Readings from Last 3 Encounters:  01/27/18 122/72  12/09/17 124/80  10/01/17 118/90  Wt Readings from Last 3 Encounters:  01/27/18 227 lb (103 kg)  12/09/17 228 lb (103.4 kg)  10/01/17 222 lb (100.7 kg)    Physical Exam  Constitutional: He is oriented to person, place, and time. No distress.  HENT:  Mouth/Throat: Oropharynx is clear and moist. No oropharyngeal exudate.  Eyes: Conjunctivae are normal. No scleral icterus.  Neck: Normal range of motion. Neck supple. No JVD present. No thyromegaly present.  Cardiovascular: Normal rate, regular rhythm and normal heart sounds.  No murmur  heard. Pulmonary/Chest: Effort normal and breath sounds normal. No respiratory distress. He has no wheezes. He has no rhonchi. He has no rales.  Abdominal: Soft. Bowel sounds are normal. He exhibits no mass. There is no hepatosplenomegaly. There is no tenderness.  Musculoskeletal: Normal range of motion. He exhibits no edema, tenderness or deformity.  Lymphadenopathy:    He has no cervical adenopathy.  Neurological: He is alert and oriented to person, place, and time.  Skin: Skin is dry. No rash noted. He is not diaphoretic.  Psychiatric: He has a normal mood and affect. His behavior is normal. Judgment and thought content normal.  Vitals reviewed.   Lab Results  Component Value Date   WBC 7.2 01/27/2018   HGB 16.9 01/27/2018   HCT 48.8 01/27/2018   PLT 246.0 01/27/2018   GLUCOSE 89 01/27/2018   CHOL 241 (H) 10/01/2017   TRIG (H) 01/27/2018    583.0 Triglyceride is over 400; calculations on Lipids are invalid.   HDL 36.50 (L) 10/01/2017   LDLDIRECT 161.0 10/01/2017   LDLCALC 107 (H) 06/21/2015   ALT 27 01/27/2018   AST 23 01/27/2018   NA 137 01/27/2018   K 4.2 01/27/2018   CL 103 01/27/2018   CREATININE 1.65 (H) 01/27/2018   BUN 20 01/27/2018   CO2 29 01/27/2018   TSH 1.18 04/18/2017    No results found.  Assessment & Plan:   Steven Bauer was seen today for hyperlipidemia.  Diagnoses and all orders for this visit:  Pure hyperglyceridemia- His triglycerides remain elevated just above 500.  I have asked him to be more compliant with the omega-3 fish oils and to work on his lifestyle modifications. -     Triglycerides; Future -     omega-3 acid ethyl esters (LOVAZA) 1 g capsule; Take 2 capsules (2 g total) by mouth 2 (two) times daily.  Chronic nocturnal hyperhidrosis -     Zinc; Future  Fatigue, unspecified type- His labs are negative for any secondary causes of this.  I am concerned he might have sleep apnea so have asked him to be seen by sleep medicine. 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  Screening for HIV and hep B is negative.  He can continue taking Truvada. -     RPR; Future -     Hepatitis B surface antigen; Future -     HIV antibody; Future  History of anaphylaxis -     EPINEPHrine 0.3 mg/0.3 mL IJ SOAJ injection; Inject 0.3 mLs (0.3 mg total) into the muscle once for 1 dose.  Snoring -     Ambulatory referral to Sleep Studies  Erectile dysfunction due to arterial insufficiency -     Avanafil (STENDRA) 100 MG TABS;  Take 1 tablet by mouth daily as needed.   I have discontinued Orlene ErmLarry S. Bauer's tadalafil, aspirin EC, cetirizine-pseudoephedrine, and NIFEdipine. I have also changed his TRUVADA to emtricitabine-tenofovir. Additionally, I am having him start on Avanafil. Lastly, I am having him maintain his fluticasone, valACYclovir, buPROPion, zolpidem, propantheline, escitalopram, and omega-3 acid ethyl esters.  Meds ordered this encounter  Medications  . EPINEPHrine 0.3 mg/0.3 mL IJ SOAJ injection    Sig: Inject 0.3 mLs (0.3 mg total) into the muscle once for 1 dose.    Dispense:  2 Device    Refill:  11  . Avanafil (STENDRA) 100 MG TABS    Sig: Take 1 tablet by mouth daily as needed.    Dispense:  10 tablet    Refill:  5  . omega-3 acid ethyl esters (LOVAZA) 1 g capsule    Sig: Take 2 capsules (2 g total) by mouth 2 (two) times daily.    Dispense:  360 capsule    Refill:  1  . emtricitabine-tenofovir (TRUVADA) 200-300 MG tablet    Sig: Take 1 tablet by mouth daily.    Dispense:  90 tablet    Refill:  0     Follow-up: Return in about 3 months (around 04/29/2018).  Sanda Lingerhomas Cena Bruhn, MD

## 2018-01-27 NOTE — Patient Instructions (Signed)

## 2018-01-28 ENCOUNTER — Encounter: Payer: Self-pay | Admitting: Internal Medicine

## 2018-01-28 ENCOUNTER — Other Ambulatory Visit: Payer: Self-pay | Admitting: Internal Medicine

## 2018-01-28 DIAGNOSIS — E60 Dietary zinc deficiency: Secondary | ICD-10-CM

## 2018-01-28 LAB — RPR: RPR Ser Ql: NONREACTIVE

## 2018-01-28 LAB — HEPATITIS B SURFACE ANTIGEN: Hepatitis B Surface Ag: NONREACTIVE

## 2018-01-28 LAB — HIV ANTIBODY (ROUTINE TESTING W REFLEX): HIV: NONREACTIVE

## 2018-01-28 LAB — ZINC: ZINC: 58 ug/dL — AB (ref 60–130)

## 2018-01-28 MED ORDER — ZINC GLUCONATE 50 MG PO TABS: 50.0000 mg | ORAL_TABLET | Freq: Every day | ORAL | 1 refills | Status: DC

## 2018-01-28 MED ORDER — OMEGA-3-ACID ETHYL ESTERS 1 G PO CAPS: 2.0000 g | ORAL_CAPSULE | Freq: Two times a day (BID) | ORAL | 1 refills | Status: DC

## 2018-01-28 MED ORDER — EMTRICITABINE-TENOFOVIR DF 200-300 MG PO TABS: 1.0000 | ORAL_TABLET | Freq: Every day | ORAL | 0 refills | Status: DC

## 2018-02-04 ENCOUNTER — Other Ambulatory Visit: Payer: Self-pay | Admitting: Internal Medicine

## 2018-02-04 DIAGNOSIS — Z720 Tobacco use: Secondary | ICD-10-CM

## 2018-02-06 ENCOUNTER — Other Ambulatory Visit: Payer: Self-pay | Admitting: Internal Medicine

## 2018-02-06 ENCOUNTER — Encounter: Payer: Self-pay | Admitting: Internal Medicine

## 2018-02-06 DIAGNOSIS — N5201 Erectile dysfunction due to arterial insufficiency: Secondary | ICD-10-CM

## 2018-02-06 MED ORDER — AVANAFIL 100 MG PO TABS: 1.0000 | ORAL_TABLET | Freq: Every day | ORAL | 5 refills | Status: AC | PRN

## 2018-02-14 ENCOUNTER — Encounter: Payer: Self-pay | Admitting: Internal Medicine

## 2018-02-14 MED ORDER — CETIRIZINE HCL 10 MG PO TABS: 10.0000 mg | ORAL_TABLET | Freq: Every day | ORAL | 3 refills | Status: DC

## 2018-02-14 NOTE — Telephone Encounter (Signed)
Pt needs an appt for prednisone

## 2018-02-16 ENCOUNTER — Encounter: Payer: Self-pay | Admitting: Internal Medicine

## 2018-02-17 ENCOUNTER — Encounter: Payer: Self-pay | Admitting: Internal Medicine

## 2018-02-17 ENCOUNTER — Ambulatory Visit: Payer: Managed Care, Other (non HMO) | Admitting: Internal Medicine

## 2018-02-17 VITALS — BP 132/78 | HR 78 | Temp 98.8°F | Resp 16 | Ht 72.0 in | Wt 220.8 lb

## 2018-02-17 DIAGNOSIS — J0101 Acute recurrent maxillary sinusitis: Secondary | ICD-10-CM

## 2018-02-17 DIAGNOSIS — J320 Chronic maxillary sinusitis: Secondary | ICD-10-CM | POA: Diagnosis not present

## 2018-02-17 DIAGNOSIS — J301 Allergic rhinitis due to pollen: Secondary | ICD-10-CM

## 2018-02-17 MED ORDER — AMOXICILLIN-POT CLAVULANATE 875-125 MG PO TABS: 1.0000 | ORAL_TABLET | Freq: Two times a day (BID) | ORAL | 0 refills | Status: AC

## 2018-02-17 MED ORDER — METHYLPREDNISOLONE 4 MG PO TBPK: ORAL_TABLET | ORAL | 0 refills | Status: AC

## 2018-02-17 NOTE — Patient Instructions (Signed)

## 2018-02-18 ENCOUNTER — Encounter: Payer: Self-pay | Admitting: Internal Medicine

## 2018-02-18 NOTE — Progress Notes (Signed)
Subjective:  Patient ID: Steven Bauer, male    DOB: 09/22/1975  Age: 42 y.o. MRN: [id number]  CC: Sinusitis   HPI Steven Bauer presents for a 5-day history of low-grade fever, ST, thick yellow-green nasal phlegm and thick yellow-green productive cough with chills, sneezing, facial pain, and nasal congestion.  He denies headache, earache, hemoptysis, shortness of breath, or night sweats.  He tells me that Zyrtec-D is not helping.  Is a complicated sinus history and is about 7 months status post facial fractures.  Outpatient Medications Prior to Visit  Medication Sig Dispense Refill  . Avanafil (STENDRA) 100 MG TABS Take 1 tablet by mouth daily as needed. 10 tablet 5  . buPROPion (WELLBUTRIN XL) 150 MG 24 hr tablet TAKE 1 TABLET BY MOUTH EVERY DAY 90 tablet 1  . cetirizine (ZYRTEC) 10 MG tablet Take 1 tablet (10 mg total) by mouth daily. 30 tablet 3  . emtricitabine-tenofovir (TRUVADA) 200-300 MG tablet Take 1 tablet by mouth daily. 90 tablet 0  . escitalopram (LEXAPRO) 10 MG tablet TAKE 1 TABLET (10 MG TOTAL) BY MOUTH AT BEDTIME. 90 tablet 1  . fluticasone (FLONASE) 50 MCG/ACT nasal spray Place 2 sprays into both nostrils daily. 16 g 11  . omega-3 acid ethyl esters (LOVAZA) 1 g capsule Take 2 capsules (2 g total) by mouth 2 (two) times daily. 360 capsule 1  . propantheline (PROBANTHINE) 15 MG tablet TAKE 1 TABLET BY MOUTH EVERYDAY AT BEDTIME 90 tablet 1  . testosterone cypionate (DEPOTESTOSTERONE CYPIONATE) 200 MG/ML injection Inject 1 mL (200 mg total) into the muscle every 14 (fourteen) days. 10 mL 0  . valACYclovir (VALTREX) 1000 MG tablet TAKE 1 TABLET BY MOUTH EVERY DAY 90 tablet 1  . zinc gluconate 50 MG tablet Take 1 tablet (50 mg total) by mouth daily. 90 tablet 1  . zolpidem (AMBIEN CR) 12.5 MG CR tablet TAKE 1 TABLET BY MOUTH EVERY DAY AT BEDTIME AS NEEDED FOR SLEEP 30 tablet 3   No facility-administered medications prior to visit.     ROS Review of Systems    Constitutional: Positive for chills and fever. Negative for appetite change, diaphoresis and fatigue.  HENT: Positive for congestion, rhinorrhea, sinus pressure, sinus pain, sneezing and sore throat. Negative for facial swelling and trouble swallowing.   Eyes: Negative.   Respiratory: Positive for cough. Negative for chest tightness, shortness of breath and wheezing.   Cardiovascular: Negative for chest pain, palpitations and leg swelling.  Gastrointestinal: Negative for abdominal pain, constipation, diarrhea, nausea and vomiting.  Genitourinary: Negative.   Musculoskeletal: Negative.  Negative for back pain and myalgias.  Skin: Negative for rash.  Neurological: Negative.  Negative for dizziness, weakness and headaches.  Hematological: Negative for adenopathy. Does not bruise/bleed easily.  Psychiatric/Behavioral: Negative.     Objective:  BP 132/78 (BP Location: Left Arm, Patient Position: Sitting, Cuff Size: Large)   Pulse 78   Temp 98.8 F (37.1 C) (Oral)   Resp 16   Ht 6' (1.829 m)   Wt 220 lb 12 oz (100.1 kg)   SpO2 98%   BMI 29.94 kg/m   BP Readings from Last 3 Encounters:  02/17/18 132/78  01/27/18 122/72  12/09/17 124/80    Wt Readings from Last 3 Encounters:  02/17/18 220 lb 12 oz (100.1 kg)  01/27/18 227 lb (103 kg)  12/09/17 228 lb (103.4 kg)    Physical Exam  Constitutional: He is oriented to person, place, and time. No distress.  HENT:  Right Ear: Hearing, tympanic membrane, external ear and ear canal normal.  Left Ear: Hearing, tympanic membrane, external ear and ear canal normal.  Nose: Mucosal edema present. No rhinorrhea, sinus tenderness or nasal deformity. No epistaxis. Right sinus exhibits maxillary sinus tenderness. Right sinus exhibits no frontal sinus tenderness. Left sinus exhibits no maxillary sinus tenderness and no frontal sinus tenderness.  Mouth/Throat: Mucous membranes are normal. Mucous membranes are not pale, not dry and not cyanotic.  Posterior oropharyngeal erythema present. No oropharyngeal exudate, posterior oropharyngeal edema or tonsillar abscesses. Tonsils are 1+ on the right. Tonsils are 1+ on the left.  Eyes: Conjunctivae are normal. No scleral icterus.  Neck: Normal range of motion. Neck supple. No JVD present. No thyromegaly present.  Cardiovascular: Normal rate, regular rhythm and normal heart sounds.  No murmur heard. Pulmonary/Chest: Effort normal and breath sounds normal. He has no wheezes. He has no rales.  Abdominal: Soft. Normal appearance and bowel sounds are normal. He exhibits no mass. There is no hepatosplenomegaly. There is no tenderness.  Musculoskeletal: Normal range of motion. He exhibits no edema, tenderness or deformity.  Lymphadenopathy:    He has no cervical adenopathy.  Neurological: He is alert and oriented to person, place, and time.  Skin: Skin is warm and dry. No rash noted. He is not diaphoretic.  Vitals reviewed.   Lab Results  Component Value Date   WBC 7.2 01/27/2018   HGB 16.9 01/27/2018   HCT 48.8 01/27/2018   PLT 246.0 01/27/2018   GLUCOSE 89 01/27/2018   CHOL 241 (H) 10/01/2017   TRIG (H) 01/27/2018    583.0 Triglyceride is over 400; calculations on Lipids are invalid.   HDL 36.50 (L) 10/01/2017   LDLDIRECT 161.0 10/01/2017   LDLCALC 107 (H) 06/21/2015   ALT 27 01/27/2018   AST 23 01/27/2018   NA 137 01/27/2018   K 4.2 01/27/2018   CL 103 01/27/2018   CREATININE 1.65 (H) 01/27/2018   BUN 20 01/27/2018   CO2 29 01/27/2018   TSH 1.18 04/18/2017    No results found.  Assessment & Plan:   Steven Bauer was seen today for sinusitis.  Diagnoses and all orders for this visit:  Seasonal allergic rhinitis due to pollen-he is having a flareup of symptoms is not responding to antihistamines and decongestants.  We will try a course of methylprednisolone. -     methylPREDNISolone (MEDROL DOSEPAK) 4 MG TBPK tablet; TAKE AS DIRECTED  Chronic sinusitis of both maxillary sinuses-  He is a smoker and is having recurrent sinus infections status post facial fractures about 7 months ago.  I have asked him to undergo CT without contrast to see if there is an obstruction of the ostiomeatal complex, mass, or tumor. -     methylPREDNISolone (MEDROL DOSEPAK) 4 MG TBPK tablet; TAKE AS DIRECTED -     CT Maxillofacial WO CM; Future  Acute recurrent maxillary sinusitis- will treat the infection with augmentin -     amoxicillin-clavulanate (AUGMENTIN) 875-125 MG tablet; Take 1 tablet by mouth 2 (two) times daily for 10 days.   I am having Steven Bauer. Steven Bauer start on amoxicillin-clavulanate and methylPREDNISolone. I am also having him maintain his fluticasone, valACYclovir, zolpidem, propantheline, escitalopram, testosterone cypionate, omega-3 acid ethyl esters, emtricitabine-tenofovir, zinc gluconate, buPROPion, Avanafil, and cetirizine.  Meds ordered this encounter  Medications  . amoxicillin-clavulanate (AUGMENTIN) 875-125 MG tablet    Sig: Take 1 tablet by mouth 2 (two) times daily for 10 days.    Dispense:  20  tablet    Refill:  0  . methylPREDNISolone (MEDROL DOSEPAK) 4 MG TBPK tablet    Sig: TAKE AS DIRECTED    Dispense:  21 tablet    Refill:  0     Follow-up: Return in about 3 weeks (around 03/10/2018).  Sanda Linger, MD

## 2018-02-19 NOTE — Telephone Encounter (Signed)
The scan has gone into clinical review with insurance and if approved, they will contact him about scheduling.  I talked with pt earlier today and he is aware

## 2018-02-21 ENCOUNTER — Ambulatory Visit (INDEPENDENT_AMBULATORY_CARE_PROVIDER_SITE_OTHER)
Admission: RE | Admit: 2018-02-21 | Discharge: 2018-02-21 | Disposition: A | Discharge status: Home / Self Care | Payer: Managed Care, Other (non HMO) | Source: Ambulatory Visit | Attending: Internal Medicine | Admitting: Internal Medicine

## 2018-02-21 DIAGNOSIS — J320 Chronic maxillary sinusitis: Secondary | ICD-10-CM

## 2018-02-22 ENCOUNTER — Encounter: Payer: Self-pay | Admitting: Internal Medicine

## 2018-03-12 ENCOUNTER — Ambulatory Visit (INDEPENDENT_AMBULATORY_CARE_PROVIDER_SITE_OTHER): Payer: Managed Care, Other (non HMO) | Admitting: *Deleted

## 2018-03-12 DIAGNOSIS — Z23 Encounter for immunization: Secondary | ICD-10-CM | POA: Diagnosis not present

## 2018-03-13 ENCOUNTER — Other Ambulatory Visit: Payer: Self-pay | Admitting: Internal Medicine

## 2018-03-13 DIAGNOSIS — F5101 Primary insomnia: Secondary | ICD-10-CM

## 2018-03-29 ENCOUNTER — Other Ambulatory Visit: Payer: Self-pay | Admitting: Internal Medicine

## 2018-03-29 DIAGNOSIS — R61 Generalized hyperhidrosis: Secondary | ICD-10-CM

## 2018-04-01 ENCOUNTER — Ambulatory Visit (INDEPENDENT_AMBULATORY_CARE_PROVIDER_SITE_OTHER): Payer: Managed Care, Other (non HMO) | Admitting: Neurology

## 2018-04-01 ENCOUNTER — Encounter: Payer: Self-pay | Admitting: Neurology

## 2018-04-01 VITALS — BP 157/97 | HR 84 | Ht 72.0 in | Wt 232.0 lb

## 2018-04-01 DIAGNOSIS — R0681 Apnea, not elsewhere classified: Secondary | ICD-10-CM

## 2018-04-01 DIAGNOSIS — G2581 Restless legs syndrome: Secondary | ICD-10-CM

## 2018-04-01 DIAGNOSIS — E669 Obesity, unspecified: Secondary | ICD-10-CM | POA: Diagnosis not present

## 2018-04-01 DIAGNOSIS — R400 Somnolence: Secondary | ICD-10-CM

## 2018-04-01 DIAGNOSIS — R0683 Snoring: Secondary | ICD-10-CM | POA: Diagnosis not present

## 2018-04-01 DIAGNOSIS — G4761 Periodic limb movement disorder: Secondary | ICD-10-CM

## 2018-04-01 NOTE — Patient Instructions (Signed)

## 2018-04-01 NOTE — Progress Notes (Signed)
Subjective:    Patient ID: Steven Bauer is a 42 y.o. male.  HPI     Steven Foley, MD, PhD [facility] [address] [address] [place], [place] [address]  Dear Dr. Yetta Bauer,   I saw your patient, Steven Bauer, upon your kind request in my neurologic clinic today for initial consultation of his sleep disorder, in particular, concern for underlying obstructive sleep apnea. The patient is unaccompanied today. As you know, Steven Bauer is a 42 year old right-handed gentleman with an underlying medical history of PVCs and PACs, back pain, hypertriglyceridemia and overweight state, who reports snoring and excessive daytime somnolence. I reviewed your office note from 01/27/2018. His Epworth sleepiness score is 19 out of 24 today, fatigue score is 45 out of 63. He wakes himself up from his snoring and wakes up drowsy often. He is not aware of his family history as he is adopted. He is single and lives alone. He uses nicotine vapor, drinks alcohol rarely, drinks caffeine in the form of tea and coffee, typically 2 servings per day and sometimes he takes a supplement before working out which likely contains caffeine. He has been noted to have breathing pauses while asleep. He denies recurrent morning headaches but has experienced restless leg symptoms and has a very restless sleep, also moves his legs while asleep. He has experienced these symptoms even before he was on Lexapro. He has difficulty falling asleep. He takes Ambien long-acting generic. He had a tonsillectomy in 2012 and septoplasty in 2018. Prior to Ambien long-acting he was on Lunesta. He had side effects including an abnormal metallic taste from it. He tries to be in bed around 10 PM, there is a TV in his bedroom and he puts it on a timer. He has no pets. He has a rise time of 6:30 but often he is already awake at 5:30. He does not have night to night nocturia.  His Past Medical History Is Significant For: Past  Medical History:  Diagnosis Date  . Chronic back pain   . HSV (herpes simplex virus) infection   . Low testosterone   . PAC (premature atrial contraction)   . PVC (premature ventricular contraction)     His Past Surgical History Is Significant For: Past Surgical History:  Procedure Laterality Date  . OPEN REDUCTION INTERNAL FIXATION (ORIF) METACARPAL Left 03/24/2015   Procedure: OPEN REDUCTION INTERNAL FIXATION (ORIF) LONG AND RING METACARPAL FRACTURE;  Surgeon: Steven Loa, MD;  Location: [facility];  Service: Orthopedics;  Laterality: Left;  . TONSILLECTOMY  2012    His Family History Is Significant For: Family History  Adopted: Yes    His Social History Is Significant For: Social History   Socioeconomic History  . Marital status: Single    Spouse name: Not on file  . Number of children: Not on file  . Years of education: Not on file  . Highest education level: Not on file  Occupational History  . Not on file  Social Needs  . Financial resource strain: Not on file  . Food insecurity:    Worry: Not on file    Inability: Not on file  . Transportation needs:    Medical: Not on file    Non-medical: Not on file  Tobacco Use  . Smoking status: Current Every Day Smoker    Packs/day: 0.50    Types: Cigarettes  . Smokeless tobacco: Former Steven Bauer    Quit date: 06/07/2015  Substance and Sexual  Activity  . Alcohol use: Yes    Alcohol/week: 50.0 standard drinks    Types: 25 Cans of beer, 25 Shots of liquor per week  . Drug use: No  . Sexual activity: Yes    Partners: Male  Lifestyle  . Physical activity:    Days per week: Not on file    Minutes per session: Not on file  . Stress: Not on file  Relationships  . Social connections:    Talks on phone: Not on file    Gets together: Not on file    Attends religious service: Not on file    Active member of club or organization: Not on file    Attends meetings of clubs or organizations: Not on file     Relationship status: Not on file  Other Topics Concern  . Not on file  Social History Narrative  . Not on file    His Allergies Are:  Allergies  Allergen Reactions  . Lunesta [Eszopiclone] Other (See Comments)    Metallic taste  . Bee Venom   . Zithromax [Azithromycin] Rash  :   His Current Medications Are:  Outpatient Encounter Medications as of 04/01/2018  Medication Sig  . Avanafil (STENDRA) 100 MG TABS Take 1 tablet by mouth daily as needed.  Marland Kitchen buPROPion (WELLBUTRIN XL) 150 MG 24 hr tablet TAKE 1 TABLET BY MOUTH EVERY DAY  . cetirizine (ZYRTEC) 10 MG tablet Take 1 tablet (10 mg total) by mouth daily.  Marland Kitchen emtricitabine-tenofovir (TRUVADA) 200-300 MG tablet Take 1 tablet by mouth daily.  Marland Kitchen escitalopram (LEXAPRO) 10 MG tablet TAKE 1 TABLET (10 MG TOTAL) BY MOUTH AT BEDTIME.  . fluticasone (FLONASE) 50 MCG/ACT nasal spray Place 2 sprays into both nostrils daily.  Marland Kitchen omega-3 acid ethyl esters (LOVAZA) 1 g capsule Take 2 capsules (2 g total) by mouth 2 (two) times daily.  . propantheline (PROBANTHINE) 15 MG tablet TAKE 1 TABLET BY MOUTH EVERYDAY AT BEDTIME  . testosterone cypionate (DEPOTESTOSTERONE CYPIONATE) 200 MG/ML injection Inject 1 mL (200 mg total) into the muscle every 14 (fourteen) days.  . valACYclovir (VALTREX) 1000 MG tablet TAKE 1 TABLET BY MOUTH EVERY DAY  . zinc gluconate 50 MG tablet Take 1 tablet (50 mg total) by mouth daily.  Marland Kitchen zolpidem (AMBIEN CR) 12.5 MG CR tablet TAKE 1 TABLET BY MOUTH EVERY DAY AT BEDTIME AS NEEDED FOR SLEEP   No facility-administered encounter medications on file as of 04/01/2018.   :  Review of Systems:  Out of a complete 14 point review of systems, all are reviewed and negative with the exception of these symptoms as listed below: Review of Systems  Neurological:       Pt mentioned that his snoring wakes him up every night and he currently takes Ambien. Pt mentioned that he wakes up feeling sleepy and groggy. Pt mentioned that he has sleep  apnea but he hasn't been officially diagnosed.   Epworth Sleepiness Scale 0= would never doze 1= slight chance of dozing 2= moderate chance of dozing 3= high chance of dozing  Sitting and reading:3 Watching TV:3 Sitting inactive in a public place (ex. Theater or meeting):3 As a passenger in a car for an hour without a break:2 Lying down to rest in the afternoon:3 Sitting and talking to someone:1 Sitting quietly after lunch (no alcohol):3 In a car, while stopped in traffic:1 Total:19     Objective:  Neurological Exam  Physical Exam Physical Examination:   Vitals:   04/01/18 1512  BP: (!) 157/97  Pulse: 84    General Examination: The patient is a very pleasant 42 y.o. male in no acute distress. He appears well-developed and well-nourished and well groomed.   HEENT: Normocephalic, atraumatic, pupils are equal, round and reactive to light and accommodation. Extraocular tracking is good without limitation to gaze excursion or nystagmus noted. Normal smooth pursuit is noted. Hearing is grossly intact. Face is symmetric with normal facial animation and normal facial sensation. Speech is clear with no dysarthria noted. There is no hypophonia. There is no lip, neck/head, jaw or voice tremor. Neck is supple with full range of passive and active motion. There are no carotid bruits on auscultation. Oropharynx exam reveals: mild mouth dryness, adequate dental hygiene and moderate airway crowding, due to Large uvula, smaller airway entry, tonsils absent, Mallampati is class II. Neck circumference is 19-1/2 inches. He has a minimal overbite.  Chest: Clear to auscultation without wheezing, rhonchi or crackles noted.  Heart: S1+S2+0, regular and normal without murmurs, rubs or gallops noted.   Abdomen: Soft, non-tender and non-distended with normal bowel sounds appreciated on auscultation.  Extremities: There is no pitting edema in the distal lower extremities bilaterally. Pedal pulses are  intact.  Skin: Warm and dry without trophic changes noted.  Musculoskeletal: exam reveals no obvious joint deformities, tenderness or joint swelling or erythema.   Neurologically:  Mental status: The patient is awake, alert and oriented in all 4 spheres. His immediate and remote memory, attention, language skills and fund of knowledge are appropriate. There is no evidence of aphasia, agnosia, apraxia or anomia. Speech is clear with normal prosody and enunciation. Thought process is linear. Mood is normal and affect is normal.  Cranial nerves II - XII are as described above under HEENT exam. In addition: shoulder shrug is normal with equal shoulder height noted.  Motor exam: Normal bulk, strength and tone is noted. There is no tremor. Romberg is negative. Fine motor skills and coordination: intact with normal finger taps, normal hand movements, normal rapid alternating patting, normal foot taps and normal foot agility.  Cerebellar testing: No dysmetria or intention tremor. There is no truncal or gait ataxia.  Sensory exam: intact to light touch in the upper and lower extremities.  Gait, station and balance: He stands easily. No veering to one side is noted. No leaning to one side is noted. Posture is age-appropriate and stance is narrow based. Gait shows normal stride length and normal pace. No problems turning are noted. Tandem walk is unremarkable.   Assessment and Plan:  In summary, Steven Bauer is a very pleasant 42 y.o.-year old male with a history and physical exam concerning for obstructive sleep apnea (OSA). I had a long chat with the patient about my findings and the diagnosis of OSA, its prognosis and treatment options. We talked about medical treatments, surgical interventions and non-pharmacological approaches. I explained in particular the risks and ramifications of untreated moderate to severe OSA, especially with respect to developing cardiovascular disease down the Road, including  congestive heart failure, difficult to treat hypertension, cardiac arrhythmias, or stroke. Even type 2 diabetes has, in part, been linked to untreated OSA. Symptoms of untreated OSA include daytime sleepiness, memory problems, mood irritability and mood disorder such as depression and anxiety, lack of energy, as well as recurrent headaches, especially morning headaches. We talked about nicotine cessation and trying to maintain a healthy lifestyle in general, as well as the importance of weight control. I encouraged the patient to eat  healthy, exercise daily and keep well hydrated, to keep a scheduled bedtime and wake time routine, to not skip any meals and eat healthy snacks in between meals. I advised the patient not to drive when feeling sleepy. I recommended the following at this time: sleep study with potential positive airway pressure titration. (We will score hypopneas at 4%).   I explained the sleep test procedure to the patient and also outlined possible surgical and non-surgical treatment options of OSA, including the use of a custom-made dental device (which would require a referral to a specialist dentist or oral surgeon), upper airway surgical options, such as pillar implants, radiofrequency surgery, tongue base surgery, and UPPP (which would involve a referral to an ENT surgeon). Rarely, jaw surgery such as mandibular advancement may be considered.  I also explained the CPAP treatment option to the patient, who indicated that he would be willing to try CPAP if the need arises. I explained the importance of being compliant with PAP treatment, not only for insurance purposes but primarily to improve His symptoms, and for the patient's long term health benefit, including to reduce His cardiovascular risks. I answered all his questions today and the patient was in agreement. I plan to see him back after the sleep study is completed and encouraged him to call with any interim questions, concerns,  problems or updates.   Thank you very much for allowing me to participate in the care of this nice patient. If I can be of any further assistance to you please do not hesitate to call me at [phone].  Sincerely,   Steven Foley, MD, PhD

## 2018-04-07 ENCOUNTER — Other Ambulatory Visit: Payer: Self-pay | Admitting: Internal Medicine

## 2018-04-07 DIAGNOSIS — E291 Testicular hypofunction: Secondary | ICD-10-CM

## 2018-04-13 ENCOUNTER — Other Ambulatory Visit: Payer: Self-pay | Admitting: Internal Medicine

## 2018-04-13 DIAGNOSIS — A6001 Herpesviral infection of penis: Secondary | ICD-10-CM

## 2018-04-16 ENCOUNTER — Telehealth: Payer: Self-pay

## 2018-04-16 DIAGNOSIS — R400 Somnolence: Secondary | ICD-10-CM

## 2018-04-16 NOTE — Telephone Encounter (Signed)
VO for HST from Dr. Athar received. HST order placed.  

## 2018-04-16 NOTE — Addendum Note (Signed)
Addended by: Geronimo RunningINKINS, Darnette Lampron A on: 04/16/2018 08:09 AM   Modules accepted: Orders

## 2018-04-16 NOTE — Telephone Encounter (Signed)
Insurance has denied in lab sleep study request. Do you want to order a HST? 

## 2018-04-21 ENCOUNTER — Telehealth: Payer: Self-pay | Admitting: Neurology

## 2018-04-21 NOTE — Telephone Encounter (Signed)
Pt is asking for a call with the results of the sleep consult

## 2018-04-21 NOTE — Telephone Encounter (Signed)
Pt is scheduled for 04/28/18 At 8am

## 2018-04-21 NOTE — Telephone Encounter (Signed)
Pt has not been scheduled for his HST. Please call him and give him an update ASAP.

## 2018-04-23 ENCOUNTER — Other Ambulatory Visit: Payer: Self-pay | Admitting: Internal Medicine

## 2018-04-23 DIAGNOSIS — Z7252 High risk homosexual behavior: Secondary | ICD-10-CM

## 2018-04-28 ENCOUNTER — Ambulatory Visit (INDEPENDENT_AMBULATORY_CARE_PROVIDER_SITE_OTHER): Payer: Managed Care, Other (non HMO) | Admitting: Neurology

## 2018-04-28 DIAGNOSIS — G4733 Obstructive sleep apnea (adult) (pediatric): Secondary | ICD-10-CM

## 2018-04-28 DIAGNOSIS — R400 Somnolence: Secondary | ICD-10-CM

## 2018-05-07 ENCOUNTER — Telehealth: Payer: Self-pay | Admitting: *Deleted

## 2018-05-07 ENCOUNTER — Other Ambulatory Visit: Payer: Self-pay | Admitting: Otolaryngology

## 2018-05-07 NOTE — Telephone Encounter (Signed)
error 

## 2018-05-08 ENCOUNTER — Encounter (HOSPITAL_BASED_OUTPATIENT_CLINIC_OR_DEPARTMENT_OTHER): Payer: Self-pay | Admitting: *Deleted

## 2018-05-08 ENCOUNTER — Other Ambulatory Visit: Payer: Self-pay

## 2018-05-08 NOTE — Telephone Encounter (Signed)
-----   Message from [name], MD sent at 05/08/2018  8:49 AM EDT ----- Patient referred by Dr. Yetta Barre, seen by me on 04/01/18, HST on 04/28/18.  Please call and notify the patient that the recent home sleep test showed obstructive sleep apnea. OSA is the mild range. Given his sleepiness complaint, I recommend treatment for in the form of autoPAP, which means, that we don't have to bring him in for a sleep study with CPAP, but will let him try an autoPAP machine at home, through a DME company (of his choice, or as per insurance requirement). The DME representative will educate him on how to use the machine, how to put the mask on, etc. I have placed an order in the chart. Please send referral, talk to patient, send report to referring MD. We will need a FU in sleep clinic for 10 weeks post-PAP set up, please arrange that with me or one of our NPs. Thanks,   [name], MD, PhD [facility])

## 2018-05-08 NOTE — Progress Notes (Signed)
Patient referred by Dr. Yetta Barre, seen by me on 04/01/18, HST on 04/28/18.  Please call and notify the patient that the recent home sleep test showed obstructive sleep apnea. OSA is the mild range. Given his sleepiness complaint, I recommend treatment for in the form of autoPAP, which means, that we don't have to bring him in for a sleep study with CPAP, but will let him try an autoPAP machine at home, through a DME company (of his choice, or as per insurance requirement). The DME representative will educate him on how to use the machine, how to put the mask on, etc. I have placed an order in the chart. Please send referral, talk to patient, send report to referring MD. We will need a FU in sleep clinic for 10 weeks post-PAP set up, please arrange that with me or one of our NPs. Thanks,   [name], MD, PhD [facility])

## 2018-05-08 NOTE — Procedures (Signed)
[facility] @[facility] [address] [place], [place] [address] NAME:   Steven Bauer                                                               DOB: 02-Jun-1976 MEDICAL RECORD NUMBER [id number]                                                 DOS:  04/28/18 REFERRING PHYSICIAN: Sanda Linger, MD STUDY PERFORMED: Home Sleep Test HISTORY: 42 year old man with a history of PVCs and PACs, back pain, hypertriglyceridemia and borderline obesity, who reports snoring and excessive daytime somnolence. His Epworth sleepiness score is 19 out of 24 today, fatigue score is 45 out of 63. BMI: 31.4.   STUDY RESULTS:  Total Recording Time: 9  hours, 40 minutes (valid test time: 9 hours, 13 min) Total Apnea/Hypopnea Index (AHI): 10.8/h, RDI: 13.5/h Average Oxygen Saturation: 95%, Lowest Oxygen Desaturation: 85%  Total Time Oxygen Saturation Below or at 88%: 3.0 minutes  Average Heart Rate: 75 bpm (between 57 and 115 bpm) IMPRESSION: OSA RECOMMENDATION: This home sleep test demonstrates overall mild obstructive sleep apnea with a total AHI of 10.8/hour and O2 nadir of 85%. Given the patient's medical history and sleep related complaints, treatment with positive airway pressure is recommended. This can be achieved in the form of autoPAP trial/titration at home. A full night CPAP titration study will help with proper treatment settings and mask fitting, if needed. Alternative treatments may include weight loss along with avoidance of the supine sleep position, or an oral appliance in appropriate candidates. Please note that untreated obstructive sleep apnea may carry additional perioperative morbidity. Patients with significant obstructive sleep apnea should receive perioperative PAP therapy and the surgeons and particularly the anesthesiologist should be informed of the diagnosis and the severity of the sleep disordered breathing. The patient should be cautioned not to drive, work at heights, or  operate dangerous or heavy equipment when tired or sleepy. Review and reiteration of good sleep hygiene measures should be pursued with any patient. Other causes of the patient's symptoms, including circadian rhythm disturbances, an underlying mood disorder, medication effect and/or an underlying medical problem cannot be ruled out based on this test. Clinical correlation is recommended. The patient and his referring provider will be notified of the test results. The patient will be seen in follow up in sleep clinic at [facility]. I certify that I have reviewed the raw data recording prior to the issuance of this report in accordance with the standards of the American Academy of Sleep Medicine (AASM).  [name], MD, PhD [facility]) Diplomat, ABPN (Neurology and Sleep)

## 2018-05-08 NOTE — Telephone Encounter (Signed)
I called pt. I advised pt that Dr. Frances Furbish reviewed their sleep study results and found that pt has mild osa. Dr. Frances Furbish recommends that pt start an auto pap at home. I reviewed PAP compliance expectations with the pt. Pt is agreeable to starting an auto-PAP. I advised pt that an order will be sent to a DME, [facility], and [facility] will call the pt within about one week after they file with the pt's insurance. [facility] will show the pt how to use the machine, fit for masks, and troubleshoot the auto-PAP if needed. A follow up appt was made for insurance purposes with Dr. Frances Furbish on 08/04/18 at 9:30am. Pt verbalized understanding to arrive 15 minutes early and bring their auto-PAP. A letter with all of this information in it will be sent to the pt's mychart account as a reminder. I verified with the pt that the address we have on file is correct. Pt verbalized understanding of results. Pt had no questions at this time but was encouraged to call back if questions arise.

## 2018-05-08 NOTE — Addendum Note (Signed)
Addended by: [name] on: 05/08/2018 08:49 AM   Modules accepted: Orders

## 2018-05-12 ENCOUNTER — Ambulatory Visit (HOSPITAL_BASED_OUTPATIENT_CLINIC_OR_DEPARTMENT_OTHER): Payer: Managed Care, Other (non HMO) | Admitting: Anesthesiology

## 2018-05-12 ENCOUNTER — Ambulatory Visit (HOSPITAL_BASED_OUTPATIENT_CLINIC_OR_DEPARTMENT_OTHER)
Admission: RE | Admit: 2018-05-12 | Discharge: 2018-05-12 | Disposition: A | Discharge status: Home / Self Care | Payer: Managed Care, Other (non HMO) | Source: Ambulatory Visit | Attending: Otolaryngology | Admitting: Otolaryngology

## 2018-05-12 ENCOUNTER — Encounter (HOSPITAL_BASED_OUTPATIENT_CLINIC_OR_DEPARTMENT_OTHER)
Admission: RE | Disposition: A | Discharge status: Home / Self Care | Payer: Self-pay | Source: Ambulatory Visit | Attending: Otolaryngology

## 2018-05-12 DIAGNOSIS — Z7951 Long term (current) use of inhaled steroids: Secondary | ICD-10-CM | POA: Insufficient documentation

## 2018-05-12 DIAGNOSIS — J322 Chronic ethmoidal sinusitis: Secondary | ICD-10-CM | POA: Insufficient documentation

## 2018-05-12 DIAGNOSIS — G4730 Sleep apnea, unspecified: Secondary | ICD-10-CM | POA: Insufficient documentation

## 2018-05-12 DIAGNOSIS — Z79899 Other long term (current) drug therapy: Secondary | ICD-10-CM | POA: Diagnosis not present

## 2018-05-12 DIAGNOSIS — J343 Hypertrophy of nasal turbinates: Secondary | ICD-10-CM | POA: Insufficient documentation

## 2018-05-12 DIAGNOSIS — J320 Chronic maxillary sinusitis: Secondary | ICD-10-CM | POA: Insufficient documentation

## 2018-05-12 DIAGNOSIS — N189 Chronic kidney disease, unspecified: Secondary | ICD-10-CM | POA: Diagnosis not present

## 2018-05-12 DIAGNOSIS — F17210 Nicotine dependence, cigarettes, uncomplicated: Secondary | ICD-10-CM | POA: Insufficient documentation

## 2018-05-12 DIAGNOSIS — J3489 Other specified disorders of nose and nasal sinuses: Secondary | ICD-10-CM | POA: Insufficient documentation

## 2018-05-12 HISTORY — PX: NASAL SINUS SURGERY: SHX719

## 2018-05-12 HISTORY — DX: Chronic kidney disease, unspecified: N18.9

## 2018-05-12 HISTORY — DX: Cardiac arrhythmia, unspecified: I49.9

## 2018-05-12 HISTORY — DX: Sleep apnea, unspecified: G47.30

## 2018-05-12 HISTORY — PX: NASAL TURBINATE REDUCTION: SHX2072

## 2018-05-12 SURGERY — SINUS SURGERY, ENDOSCOPIC
Anesthesia: General | Site: Nose

## 2018-05-12 MED ORDER — ROCURONIUM BROMIDE 50 MG/5ML IV SOSY
PREFILLED_SYRINGE | INTRAVENOUS | Status: AC
  Filled 2018-05-12: qty 5

## 2018-05-12 MED ORDER — PROPOFOL 10 MG/ML IV BOLUS
INTRAVENOUS | Status: AC
  Filled 2018-05-12: qty 20

## 2018-05-12 MED ORDER — OXYMETAZOLINE HCL 0.05 % NA SOLN
NASAL | Status: DC | PRN
  Administered 2018-05-12: 1 via TOPICAL

## 2018-05-12 MED ORDER — ONDANSETRON HCL 4 MG/2ML IJ SOLN
INTRAMUSCULAR | Status: DC | PRN
  Administered 2018-05-12 (×2): 4 mg via INTRAVENOUS

## 2018-05-12 MED ORDER — OXYMETAZOLINE HCL 0.05 % NA SOLN
NASAL | Status: AC
  Filled 2018-05-12: qty 15

## 2018-05-12 MED ORDER — HYDROMORPHONE HCL 1 MG/ML IJ SOLN
INTRAMUSCULAR | Status: AC
  Filled 2018-05-12: qty 0.5

## 2018-05-12 MED ORDER — CEFAZOLIN SODIUM-DEXTROSE 2-4 GM/100ML-% IV SOLN
2.0000 g | INTRAVENOUS | Status: AC
  Administered 2018-05-12: 2 g via INTRAVENOUS

## 2018-05-12 MED ORDER — OXYCODONE-ACETAMINOPHEN 5-325 MG PO TABS: 1.0000 | ORAL_TABLET | Freq: Four times a day (QID) | ORAL | 0 refills | Status: DC | PRN

## 2018-05-12 MED ORDER — BACITRACIN ZINC 500 UNIT/GM EX OINT
TOPICAL_OINTMENT | CUTANEOUS | Status: AC
  Filled 2018-05-12: qty 28.35

## 2018-05-12 MED ORDER — MIDAZOLAM HCL 5 MG/5ML IJ SOLN
INTRAMUSCULAR | Status: DC | PRN
  Administered 2018-05-12: 2 mg via INTRAVENOUS

## 2018-05-12 MED ORDER — DEXAMETHASONE SODIUM PHOSPHATE 10 MG/ML IJ SOLN
INTRAMUSCULAR | Status: DC | PRN
  Administered 2018-05-12: 10 mg via INTRAVENOUS

## 2018-05-12 MED ORDER — LACTATED RINGERS IV SOLN
INTRAVENOUS | Status: DC
  Administered 2018-05-12 (×2): via INTRAVENOUS

## 2018-05-12 MED ORDER — LIDOCAINE-EPINEPHRINE 1 %-1:100000 IJ SOLN
INTRAMUSCULAR | Status: AC
  Filled 2018-05-12: qty 1

## 2018-05-12 MED ORDER — FENTANYL CITRATE (PF) 100 MCG/2ML IJ SOLN
INTRAMUSCULAR | Status: DC | PRN
  Administered 2018-05-12 (×2): 100 ug via INTRAVENOUS

## 2018-05-12 MED ORDER — MIDAZOLAM HCL 2 MG/2ML IJ SOLN: 1.0000 mg | INTRAMUSCULAR | Status: DC | PRN

## 2018-05-12 MED ORDER — PROPOFOL 10 MG/ML IV BOLUS
INTRAVENOUS | Status: DC | PRN
  Administered 2018-05-12: 200 mg via INTRAVENOUS

## 2018-05-12 MED ORDER — PHENYLEPHRINE HCL 10 MG/ML IJ SOLN
INTRAMUSCULAR | Status: DC | PRN
  Administered 2018-05-12 (×4): 100 ug via INTRAVENOUS

## 2018-05-12 MED ORDER — PHENYLEPHRINE HCL 10 MG/ML IJ SOLN
INTRAMUSCULAR | Status: AC
  Filled 2018-05-12: qty 1

## 2018-05-12 MED ORDER — MUPIROCIN 2 % EX OINT
TOPICAL_OINTMENT | CUTANEOUS | Status: AC
  Filled 2018-05-12: qty 22

## 2018-05-12 MED ORDER — LIDOCAINE 2% (20 MG/ML) 5 ML SYRINGE
INTRAMUSCULAR | Status: AC
  Filled 2018-05-12: qty 5

## 2018-05-12 MED ORDER — LIDOCAINE HCL (CARDIAC) PF 100 MG/5ML IV SOSY
PREFILLED_SYRINGE | INTRAVENOUS | Status: DC | PRN
  Administered 2018-05-12: 50 mg via INTRAVENOUS

## 2018-05-12 MED ORDER — FENTANYL CITRATE (PF) 100 MCG/2ML IJ SOLN: 50.0000 ug | INTRAMUSCULAR | Status: DC | PRN

## 2018-05-12 MED ORDER — MIDAZOLAM HCL 2 MG/2ML IJ SOLN
INTRAMUSCULAR | Status: AC
  Filled 2018-05-12: qty 2

## 2018-05-12 MED ORDER — CEFAZOLIN SODIUM-DEXTROSE 2-4 GM/100ML-% IV SOLN
INTRAVENOUS | Status: AC
  Filled 2018-05-12: qty 100

## 2018-05-12 MED ORDER — FENTANYL CITRATE (PF) 100 MCG/2ML IJ SOLN
INTRAMUSCULAR | Status: AC
  Filled 2018-05-12: qty 4

## 2018-05-12 MED ORDER — CEPHALEXIN 500 MG PO CAPS: 500.0000 mg | ORAL_CAPSULE | Freq: Three times a day (TID) | ORAL | 0 refills | Status: AC

## 2018-05-12 MED ORDER — GLYCOPYRROLATE PF 0.2 MG/ML IJ SOSY
PREFILLED_SYRINGE | INTRAMUSCULAR | Status: AC
  Filled 2018-05-12: qty 1

## 2018-05-12 MED ORDER — GLYCOPYRROLATE 0.2 MG/ML IJ SOLN
INTRAMUSCULAR | Status: DC | PRN
  Administered 2018-05-12: 0.2 mg via INTRAVENOUS

## 2018-05-12 MED ORDER — SCOPOLAMINE 1 MG/3DAYS TD PT72: 1.0000 | MEDICATED_PATCH | Freq: Once | TRANSDERMAL | Status: DC | PRN

## 2018-05-12 MED ORDER — PROMETHAZINE HCL 25 MG/ML IJ SOLN: 6.2500 mg | INTRAMUSCULAR | Status: DC | PRN

## 2018-05-12 MED ORDER — LIDOCAINE-EPINEPHRINE 1 %-1:100000 IJ SOLN
INTRAMUSCULAR | Status: DC | PRN
  Administered 2018-05-12: 12 mL

## 2018-05-12 MED ORDER — SUGAMMADEX SODIUM 200 MG/2ML IV SOLN
INTRAVENOUS | Status: DC | PRN
  Administered 2018-05-12: 200 mg via INTRAVENOUS

## 2018-05-12 MED ORDER — ONDANSETRON HCL 4 MG/2ML IJ SOLN
INTRAMUSCULAR | Status: AC
  Filled 2018-05-12: qty 2

## 2018-05-12 MED ORDER — HYDROMORPHONE HCL 1 MG/ML IJ SOLN
0.2500 mg | INTRAMUSCULAR | Status: DC | PRN
  Administered 2018-05-12: 0.5 mg via INTRAVENOUS

## 2018-05-12 MED ORDER — DEXAMETHASONE SODIUM PHOSPHATE 10 MG/ML IJ SOLN
INTRAMUSCULAR | Status: AC
  Filled 2018-05-12: qty 1

## 2018-05-12 MED ORDER — ROCURONIUM BROMIDE 100 MG/10ML IV SOLN
INTRAVENOUS | Status: DC | PRN
  Administered 2018-05-12: 50 mg via INTRAVENOUS

## 2018-05-12 MED ORDER — PHENYLEPHRINE 40 MCG/ML (10ML) SYRINGE FOR IV PUSH (FOR BLOOD PRESSURE SUPPORT)
PREFILLED_SYRINGE | INTRAVENOUS | Status: AC
  Filled 2018-05-12: qty 20

## 2018-05-12 SURGICAL SUPPLY — 53 items
ATTRACTOMAT 16X20 MAGNETIC DRP (DRAPES) IMPLANT
BLADE INF TURB ROT M4 2 5PK (BLADE) IMPLANT
BLADE RAD40 ROTATE 4M 4 5PK (BLADE) ×2 IMPLANT
BLADE RAD60 ROTATE M4 4 5PK (BLADE) ×2 IMPLANT
BLADE TRICUT ROTATE M4 4 5PK (BLADE) ×2 IMPLANT
BUR HS RAD FRONTAL 3 (BURR) IMPLANT
CANISTER SUC SOCK COL 7IN (MISCELLANEOUS) ×2 IMPLANT
CANISTER SUCT 1200ML W/VALVE (MISCELLANEOUS) ×4 IMPLANT
COAGULATOR SUCT 8FR VV (MISCELLANEOUS) IMPLANT
COAGULATOR SUCT SWTCH 10FR 6 (ELECTROSURGICAL) IMPLANT
CORD BIPOLAR FORCEPS 12FT (ELECTRODE) IMPLANT
COVER WAND RF STERILE (DRAPES) IMPLANT
DECANTER SPIKE VIAL GLASS SM (MISCELLANEOUS) IMPLANT
DRESSING ADAPTIC 1/2  N-ADH (PACKING) IMPLANT
DRESSING NASAL KENNEDY 3.5X.9 (MISCELLANEOUS) IMPLANT
DRESSING NASL FOAM PST OP SINU (MISCELLANEOUS) ×1 IMPLANT
DRSG NASAL FOAM POST OP SINU (MISCELLANEOUS) ×2
DRSG NASAL KENNEDY 3.5X.9 (MISCELLANEOUS)
DRSG NASAL KENNEDY LMNT 8CM (GAUZE/BANDAGES/DRESSINGS) IMPLANT
DRSG NASOPORE 8CM (GAUZE/BANDAGES/DRESSINGS) IMPLANT
DRSG TELFA 3X8 NADH (GAUZE/BANDAGES/DRESSINGS) IMPLANT
ELECT COATED BLADE 2.86 ST (ELECTRODE) IMPLANT
ELECT REM PT RETURN 9FT ADLT (ELECTROSURGICAL)
ELECTRODE REM PT RTRN 9FT ADLT (ELECTROSURGICAL) IMPLANT
GAUZE 4X4 16PLY RFD (DISPOSABLE) IMPLANT
GAUZE VASELINE FOILPK 1/2 X 72 (GAUZE/BANDAGES/DRESSINGS) IMPLANT
GLOVE BIO SURGEON STRL SZ 6.5 (GLOVE) ×2 IMPLANT
GLOVE BIO SURGEON STRL SZ7.5 (GLOVE) ×2 IMPLANT
GLOVE BIOGEL PI IND STRL 7.0 (GLOVE) ×2 IMPLANT
GLOVE BIOGEL PI INDICATOR 7.0 (GLOVE) ×2
GOWN STRL REUS W/ TWL LRG LVL3 (GOWN DISPOSABLE) ×2 IMPLANT
GOWN STRL REUS W/TWL LRG LVL3 (GOWN DISPOSABLE) ×2
HEMOSTAT SURGICEL .5X2 ABSORB (HEMOSTASIS) IMPLANT
IV NS 500ML (IV SOLUTION) ×2
IV NS 500ML BAXH (IV SOLUTION) ×2 IMPLANT
NEEDLE PRECISIONGLIDE 27X1.5 (NEEDLE) ×2 IMPLANT
NEEDLE SPNL 25GX3.5 QUINCKE BL (NEEDLE) ×2 IMPLANT
NS IRRIG 1000ML POUR BTL (IV SOLUTION) IMPLANT
PACK BASIN DAY SURGERY FS (CUSTOM PROCEDURE TRAY) ×2 IMPLANT
PACK ENT DAY SURGERY (CUSTOM PROCEDURE TRAY) ×2 IMPLANT
PATTIES SURGICAL .5 X3 (DISPOSABLE) ×2 IMPLANT
PENCIL BUTTON HOLSTER BLD 10FT (ELECTRODE) IMPLANT
SHEATH ENDOSCRUB 45 DEG (SHEATH) ×2 IMPLANT
SLEEVE SCD COMPRESS KNEE MED (MISCELLANEOUS) ×2 IMPLANT
SOLUTION ANTI FOG 6CC (MISCELLANEOUS) ×2 IMPLANT
SPONGE GAUZE 2X2 8PLY STRL LF (GAUZE/BANDAGES/DRESSINGS) ×2 IMPLANT
SUT CHROMIC 2 0 SH (SUTURE) IMPLANT
SUT ETHILON 3 0 PS 1 (SUTURE) IMPLANT
TOWEL GREEN STERILE FF (TOWEL DISPOSABLE) ×2 IMPLANT
TRAY DSU PREP LF (CUSTOM PROCEDURE TRAY) ×2 IMPLANT
TUBE CONNECTING 20X1/4 (TUBING) ×4 IMPLANT
TUBE SALEM SUMP 16 FR W/ARV (TUBING) IMPLANT
YANKAUER SUCT BULB TIP NO VENT (SUCTIONS) ×2 IMPLANT

## 2018-05-12 NOTE — Brief Op Note (Signed)
05/12/2018  11:38 AM  PATIENT:  Steven Bauer  42 y.o. 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 (N/A)  SURGEON:  Surgeon(s) and Role:    * Christia Reading, MD - Primary  PHYSICIAN ASSISTANT:   ASSISTANTS: none   ANESTHESIA:   general  EBL: 25 cc  BLOOD ADMINISTERED:none  DRAINS: none   LOCAL MEDICATIONS USED:  LIDOCAINE   SPECIMEN:  No Specimen  DISPOSITION OF SPECIMEN:  N/A  COUNTS:  YES  TOURNIQUET:  * No tourniquets in log *  DICTATION: .Other Dictation: Dictation Number [id number]  PLAN OF CARE: Discharge to home after PACU  PATIENT DISPOSITION:  PACU - hemodynamically stable.   Delay start of Pharmacological VTE agent (>24hrs) due to surgical blood loss or risk of bleeding: yes

## 2018-05-12 NOTE — Discharge Instructions (Signed)

## 2018-05-12 NOTE — Anesthesia Procedure Notes (Signed)
Procedure Name: Intubation Date/Time: 05/12/2018 10:33 AM Performed by: Jonna Munro, CRNA Pre-anesthesia Checklist: Patient identified, Emergency Drugs available, Suction available, Patient being monitored and Timeout performed Patient Re-evaluated:Patient Re-evaluated prior to induction Oxygen Delivery Method: Circle system utilized Preoxygenation: Pre-oxygenation with 100% oxygen Induction Type: IV induction Ventilation: Oral airway inserted - appropriate to patient size and Two handed mask ventilation required Laryngoscope Size: Mac and 3 Grade View: Grade III Tube type: Oral Tube size: 7.0 mm Number of attempts: 1 Airway Equipment and Method: Stylet Placement Confirmation: positive ETCO2 and breath sounds checked- equal and bilateral Secured at: 22 cm Tube secured with: Tape Dental Injury: Teeth and Oropharynx as per pre-operative assessment

## 2018-05-12 NOTE — Op Note (Signed)
NAME: Steven Bauer, YOHN MEDICAL RECORD ZO:[id number] ACCOUNT [id number] DATE OF BIRTH:08/01/1975 FACILITY: [facility] LOCATION: [facility] PHYSICIAN:Elizabeth Haff Pearletha Alfred, MD  OPERATIVE REPORT  DATE OF PROCEDURE:  05/12/2018  PREOPERATIVE DIAGNOSES: 1.  Chronic maxillary and ethmoid sinusitis. 2.  Inferior turbinate hypertrophy.  POSTOPERATIVE DIAGNOSES:   1.  Chronic maxillary and ethmoid sinusitis. 2.  Inferior turbinate hypertrophy.  PROCEDURE: 1.  Bilateral maxillary antrostomy. 2.  Bilateral anterior ethmoidectomy. 3.  Bilateral inferior turbinate reduction.  SURGEON:  Christia Reading, MD  ANESTHESIA:  General endotracheal anesthesia.  COMPLICATIONS:  None.  INDICATIONS:  The patient is a 42 year old male who previously underwent septoplasty and turbinate reduction for nasal obstruction, but has had some recurrence of nasal obstruction along with recurring sinus infections that were demonstrated to involve  the maxillary and anterior ethmoid sinuses by CT scan.  He presents to the operating room for surgical management.  FINDINGS:  The anatomy of his sinuses and nose appeared fairly normal with evidence of previous inferior turbinate reduction.  There were no polyps or infection in the sinuses.  DESCRIPTION OF PROCEDURE:  The patient was identified in the holding room, informed consent having been obtained including discussion of risks, benefits, alternatives, the patient was brought to the operative suite and put the operative table in supine  position.  Anesthesia was induced and the patient was intubated by the anesthesia team without difficulty.  The patient was given intravenous antibiotics and steroids during the case.  The eyes were lubricated and the face was prepped and draped in  sterile fashion.  The eyes were taped closed with Steri-Strips.  Afrin-soaked pledgets were placed in both sides of the nose for several minutes and then removed.  The right nasal passage was first  evaluated with a 0 degree telescope, and the lateral  nasal wall was injected in the standard fashion.  The middle turbinate was medialized and the uncinate process, elevated off the lateral wall.  It was incised and then removed with the microdebrider.  The ethmoid bulla was also removed along with  anterior ethmoid cells using the straight microdebrider.  At this point, the lateral wall was evaluated and the natural ostium of the maxillary sinus was identified and then widened using a curved microdebrider inferiorly and posteriorly resulting in a  wide antrostomy.  The telescope and angled telescope was then used to evaluate the anterior ethmoid superiorly where septations were removed with the angled debrider, exposing the frontal recess.  Afrin pledgets were placed in the right-sided sinus  cavity.  The same surgery was then carried on the left side including medialization of the middle turbinate, removal of the uncinate process, removal of the ethmoid bulla and anterior ethmoid air cells, widening of the natural maxillary ostium  posteriorly and inferiorly, and removal of septations from the superior portion of the anterior ethmoid.  Afrin pledgets were also placed.    At this point, pledgets were removed and the inferior turbinates were injected on both sides with local anesthetic.  A stab incision was made in the anterior head of each inferior turbinate using a blade scalpel and the soft tissues were elevated off the  underlying bone.  The turbinate blade on the microdebrider was used to remove the submucosal tissue, keeping the overlying mucosa and underlying bone intact.  After this was completed, incision was made along the inferior extent of each inferior  turbinate using a blade scalpel and the inferior portion of the bone along with lateral mucosa was removed in a  piecemeal fashion using Thru-Cut forceps.  After this was completed, soft tissues were redraped over the turbinates.  The nasal  passages and  sinuses were suctioned.  A Sinu-Foam pack was then reconstituted and half the pack placed in the right ethmoid and the other half in the left ethmoid.  Afrin soaked pledgets were placed in both sides of the nose for wakeup.  Drapes were removed and  throat was suctioned.  The patient was cleaned off and returned to anesthesia for wakeup.  He was extubated in the recovery room in stable condition.  Pledgets were removed in recovery.  AN/NUANCE  D:05/12/2018 T:05/12/2018 JOB:[id number]

## 2018-05-12 NOTE — Anesthesia Preprocedure Evaluation (Signed)
Anesthesia Evaluation  Patient identified by MRN, date of birth, ID band Patient awake    Reviewed: Allergy & Precautions, NPO status , Patient's Chart, lab work & pertinent test results  Airway Mallampati: II  TM Distance: >3 FB Neck ROM: Full    Dental no notable dental hx. (+) Dental Advisory Given   Pulmonary sleep apnea , Current Smoker,    Pulmonary exam normal        Cardiovascular negative cardio ROS Normal cardiovascular exam     Neuro/Psych PSYCHIATRIC DISORDERS Anxiety Depression negative neurological ROS     GI/Hepatic negative GI ROS, Neg liver ROS,   Endo/Other  negative endocrine ROS  Renal/GU negative Renal ROS  negative genitourinary   Musculoskeletal negative musculoskeletal ROS (+)   Abdominal   Peds negative pediatric ROS (+)  Hematology negative hematology ROS (+)   Anesthesia Other Findings   Reproductive/Obstetrics negative OB ROS                             Anesthesia Physical Anesthesia Plan  ASA: II  Anesthesia Plan: General   Post-op Pain Management:    Induction: Intravenous  PONV Risk Score and Plan: 2 and Ondansetron and Dexamethasone  Airway Management Planned: Oral ETT  Additional Equipment:   Intra-op Plan:   Post-operative Plan: Extubation in OR  Informed Consent: I have reviewed the patients History and Physical, chart, labs and discussed the procedure including the risks, benefits and alternatives for the proposed anesthesia with the patient or authorized representative who has indicated his/her understanding and acceptance.   Dental advisory given  Plan Discussed with: CRNA, Anesthesiologist and Surgeon  Anesthesia Plan Comments:         Anesthesia Quick Evaluation

## 2018-05-12 NOTE — Transfer of Care (Signed)
Immediate Anesthesia Transfer of Care Note  Patient: GARELD OBRECHT  Procedure(s) Performed: ENDOSCOPIC SINUS SURGERY BILATERAL MAXILLARY ANSTROSTOMY BILATERAL ANTERIOR ETHMODECTOMY (N/A Nose) with TURBINATE REDUCTION. (N/A Nose)  Patient Location: PACU  Anesthesia Type:General  Level of Consciousness: awake, alert  and oriented  Airway & Oxygen Therapy: Patient Spontanous Breathing and Patient connected to face mask oxygen  Post-op Assessment: Report given to RN and Post -op Vital signs reviewed and stable  Post vital signs: Reviewed and stable  Last Vitals:  Vitals Value Taken Time  BP    Temp    Pulse    Resp    SpO2      Last Pain:  Vitals:   05/12/18 0855  TempSrc: Oral  PainSc: 0-No pain         Complications: No apparent anesthesia complications

## 2018-05-12 NOTE — H&P (Signed)
Steven Bauer is an 42 y.o. male.   Chief Complaint: Nasal obstruction and sinusitis HPI: 42 year old male previously underwent septoplasty and turbinate reduction has had return of nasal obstruction and also recurring sinusitis.  He presents for surgical management.  Past Medical History:  Diagnosis Date  . Chronic back pain   . Chronic kidney disease    creatinine levels stay high at 1.6 being followed on Truvada  . Dysrhythmia    PVC  . HSV (herpes simplex virus) infection   . Low testosterone   . PAC (premature atrial contraction)   . PVC (premature ventricular contraction)   . Sleep apnea    had sleep study 1 week ago no results as of yet    Past Surgical History:  Procedure Laterality Date  . NASAL SEPTOPLASTY W/ TURBINOPLASTY    . OPEN REDUCTION INTERNAL FIXATION (ORIF) METACARPAL Left 03/24/2015   Procedure: OPEN REDUCTION INTERNAL FIXATION (ORIF) LONG AND RING METACARPAL FRACTURE;  Surgeon: Betha Loa, MD;  Location: [facility];  Service: Orthopedics;  Laterality: Left;  . TONSILLECTOMY  2012    Family History  Adopted: Yes   Social History:  reports that he has been smoking cigarettes. He has a 10.00 pack-year smoking history. He quit smokeless tobacco use about 2 years ago. He reports that he drinks alcohol. He reports that he does not use drugs.  Allergies:  Allergies  Allergen Reactions  . Lunesta [Eszopiclone] Other (See Comments)    Metallic taste  . Bee Venom   . Zithromax [Azithromycin] Rash    Medications Prior to Admission  Medication Sig Dispense Refill  . buPROPion (WELLBUTRIN XL) 150 MG 24 hr tablet TAKE 1 TABLET BY MOUTH EVERY DAY 90 tablet 1  . cetirizine (ZYRTEC) 10 MG tablet Take 1 tablet (10 mg total) by mouth daily. 30 tablet 3  . escitalopram (LEXAPRO) 10 MG tablet TAKE 1 TABLET (10 MG TOTAL) BY MOUTH AT BEDTIME. 90 tablet 1  . fluticasone (FLONASE) 50 MCG/ACT nasal spray Place 2 sprays into both nostrils daily. 16 g 11  .  omega-3 acid ethyl esters (LOVAZA) 1 g capsule Take 2 capsules (2 g total) by mouth 2 (two) times daily. 360 capsule 1  . testosterone cypionate (DEPOTESTOSTERONE CYPIONATE) 200 MG/ML injection Inject 1 mL (200 mg total) into the muscle every 14 (fourteen) days. 10 mL 0  . TRUVADA 200-300 MG tablet TAKE 1 TABLET BY MOUTH EVERY DAY 30 tablet 2  . valACYclovir (VALTREX) 1000 MG tablet TAKE 1 TABLET BY MOUTH EVERY DAY 90 tablet 1  . zolpidem (AMBIEN CR) 12.5 MG CR tablet TAKE 1 TABLET BY MOUTH EVERY DAY AT BEDTIME AS NEEDED FOR SLEEP 30 tablet 3  . Avanafil (STENDRA) 100 MG TABS Take 1 tablet by mouth daily as needed. 10 tablet 5  . propantheline (PROBANTHINE) 15 MG tablet TAKE 1 TABLET BY MOUTH EVERYDAY AT BEDTIME 90 tablet 1  . testosterone cypionate (DEPOTESTOSTERONE CYPIONATE) 200 MG/ML injection INJECT 0.5 MLS (100 MG TOTAL) INTO THE MUSCLE EVERY 14 (FOURTEEN) DAYS. 10 mL 0  . zinc gluconate 50 MG tablet Take 1 tablet (50 mg total) by mouth daily. 90 tablet 1    No results found for this or any previous visit (from the past 48 hour(s)). No results found.  Review of Systems  All other systems reviewed and are negative.   Blood pressure (!) 155/87, pulse 97, temperature 98 F (36.7 C), temperature source Oral, resp. rate 18, height 6' (1.829  m), weight 103.7 kg, SpO2 98 %. Physical Exam  Constitutional: He is oriented to person, place, and time. He appears well-developed and well-nourished. No distress.  HENT:  Head: Normocephalic and atraumatic.  Right Ear: External ear normal.  Left Ear: External ear normal.  Nose: Nose normal.  Mouth/Throat: Oropharynx is clear and moist.  Eyes: Pupils are equal, round, and reactive to light. Conjunctivae and EOM are normal.  Neck: Normal range of motion. Neck supple.  Cardiovascular: Normal rate.  Respiratory: Effort normal.  Neurological: He is alert and oriented to person, place, and time. No cranial nerve deficit.  Skin: Skin is warm and dry.   Psychiatric: He has a normal mood and affect. His behavior is normal. Judgment and thought content normal.     Assessment/Plan Turbinate hypertrophy, chronic sinusitis To OR for turbinate reduction and endoscopic sinus surgery to address the maxillary and anterior ethmoid sinuses.  Christia Reading, MD 05/12/2018, 9:56 AM

## 2018-05-12 NOTE — Anesthesia Postprocedure Evaluation (Signed)
Anesthesia Post Note  Patient: BADR PIEDRA  Procedure(s) Performed: ENDOSCOPIC SINUS SURGERY BILATERAL MAXILLARY ANSTROSTOMY BILATERAL ANTERIOR ETHMODECTOMY (N/A Nose) with TURBINATE REDUCTION. (N/A Nose)     Patient location during evaluation: PACU Anesthesia Type: General Level of consciousness: sedated Pain management: pain level controlled Vital Signs Assessment: post-procedure vital signs reviewed and stable Respiratory status: spontaneous breathing and respiratory function stable Cardiovascular status: stable Postop Assessment: no apparent nausea or vomiting Anesthetic complications: no    Last Vitals:  Vitals:   05/12/18 1230 05/12/18 1324  BP: (!) 128/46 (!) 142/85  Pulse: (!) 102 92  Resp: 13 14  Temp:  37 C  SpO2: 98% 97%    Last Pain:  Vitals:   05/12/18 1324  TempSrc:   PainSc: 2                  Lynzee Lindquist DANIEL

## 2018-05-13 ENCOUNTER — Encounter (HOSPITAL_BASED_OUTPATIENT_CLINIC_OR_DEPARTMENT_OTHER): Payer: Self-pay | Admitting: Otolaryngology

## 2018-05-15 ENCOUNTER — Other Ambulatory Visit: Payer: Self-pay | Admitting: Internal Medicine

## 2018-05-15 DIAGNOSIS — E291 Testicular hypofunction: Secondary | ICD-10-CM

## 2018-05-23 ENCOUNTER — Other Ambulatory Visit: Payer: Self-pay | Admitting: Internal Medicine

## 2018-05-23 DIAGNOSIS — Z7252 High risk homosexual behavior: Secondary | ICD-10-CM

## 2018-05-23 MED ORDER — EMTRICITABINE-TENOFOVIR AF 200-25 MG PO TABS: 1.0000 | ORAL_TABLET | Freq: Every day | ORAL | 0 refills | Status: DC

## 2018-05-27 ENCOUNTER — Other Ambulatory Visit: Payer: Self-pay | Admitting: Internal Medicine

## 2018-05-30 ENCOUNTER — Other Ambulatory Visit: Payer: Self-pay | Admitting: Internal Medicine

## 2018-05-30 DIAGNOSIS — E291 Testicular hypofunction: Secondary | ICD-10-CM

## 2018-06-02 MED ORDER — TESTOSTERONE CYPIONATE 200 MG/ML IM SOLN: 200.0000 mg | INTRAMUSCULAR | 0 refills | Status: AC

## 2018-06-02 NOTE — Telephone Encounter (Signed)
Check [place] registry last filled 04/25/2018../lmb  

## 2018-06-02 NOTE — Telephone Encounter (Signed)
MD approved and sent electronically to pof../lmb  

## 2018-07-27 ENCOUNTER — Other Ambulatory Visit: Payer: Self-pay | Admitting: Internal Medicine

## 2018-07-28 ENCOUNTER — Other Ambulatory Visit: Payer: Self-pay | Admitting: Internal Medicine

## 2018-07-28 DIAGNOSIS — Z7252 High risk homosexual behavior: Secondary | ICD-10-CM

## 2018-07-28 MED ORDER — EMTRICITABINE-TENOFOVIR AF 200-25 MG PO TABS: 1.0000 | ORAL_TABLET | Freq: Every day | ORAL | 0 refills | Status: AC

## 2018-08-01 ENCOUNTER — Other Ambulatory Visit: Payer: Self-pay | Admitting: Internal Medicine

## 2018-08-01 DIAGNOSIS — Z720 Tobacco use: Secondary | ICD-10-CM

## 2018-08-01 DIAGNOSIS — E781 Pure hyperglyceridemia: Secondary | ICD-10-CM

## 2018-08-01 MED ORDER — OMEGA-3-ACID ETHYL ESTERS 1 G PO CAPS: 2.0000 g | ORAL_CAPSULE | Freq: Two times a day (BID) | ORAL | 1 refills | Status: AC

## 2018-08-04 ENCOUNTER — Ambulatory Visit: Payer: Self-pay | Admitting: Neurology

## 2018-08-04 ENCOUNTER — Telehealth: Payer: Self-pay

## 2018-08-04 NOTE — Telephone Encounter (Signed)
Pt did not show for their appt with Dr. Athar today.  

## 2018-08-05 ENCOUNTER — Encounter: Payer: Self-pay | Admitting: Neurology

## 2018-08-05 ENCOUNTER — Encounter: Payer: Self-pay | Admitting: Internal Medicine

## 2019-05-19 IMAGING — CT CT MAXILLOFACIAL W/O CM
1 of 2 series · 11 of 14 positions shown, 14 images · non-contrast
Comparison: None.

CLINICAL DATA: Evaluate for sinus disease.  Facial pain and cough.

EXAM:
CT MAXILLOFACIAL WITHOUT CONTRAST
TECHNIQUE: Multidetector CT imaging of the maxillofacial structures was
performed. Multiplanar CT image reconstructions were also generated.

[Series 4: limited sinus st · axial · 0.39mm/px · z∈[-7,+93]mm · 11 of 13 slices shown, 14 images]
[im 2/13  soft-tissue]
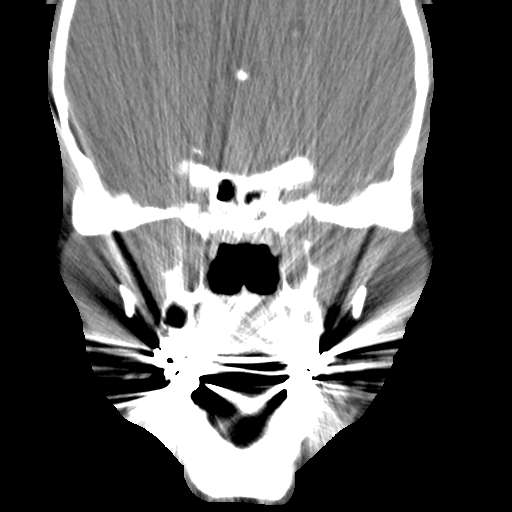
[im 2/13  bone]
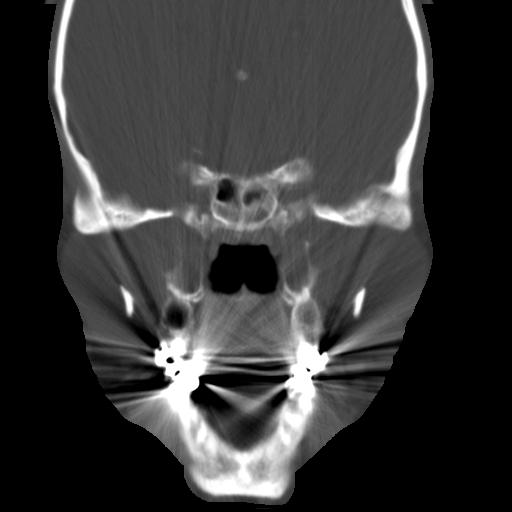
[im 3/13  bone]
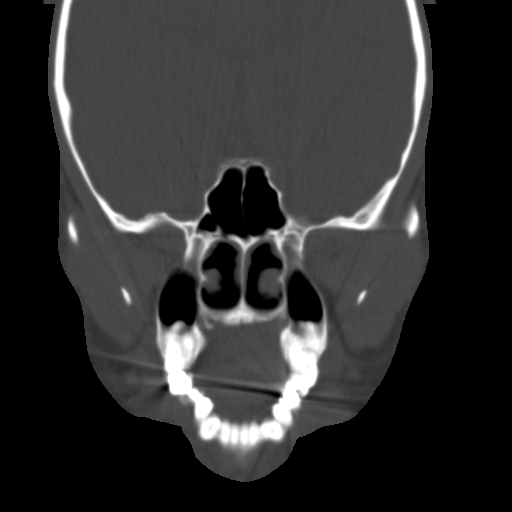
[im 4/13  bone]
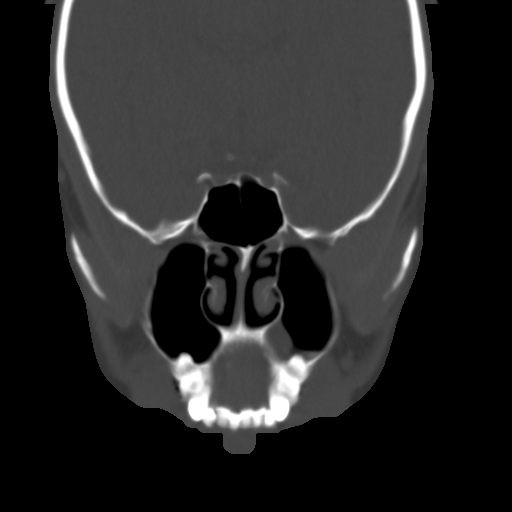
[im 5/13  bone]
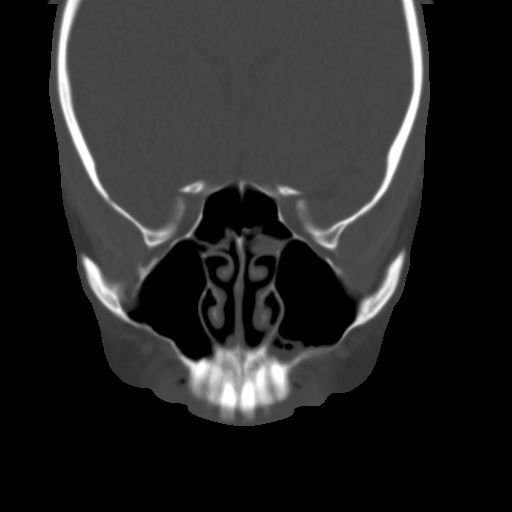
[im 6/13  soft-tissue]
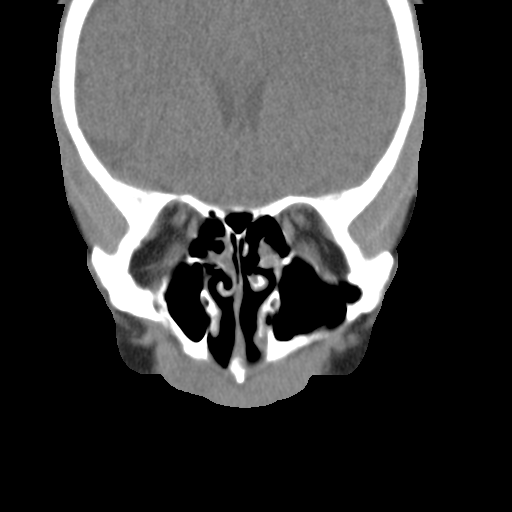
[im 6/13  bone]
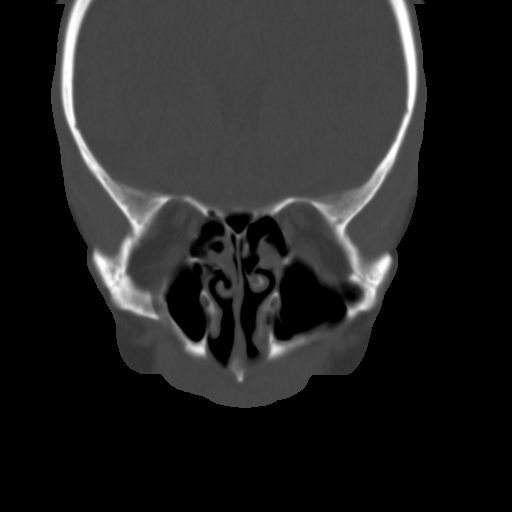
[im 7/13  bone]
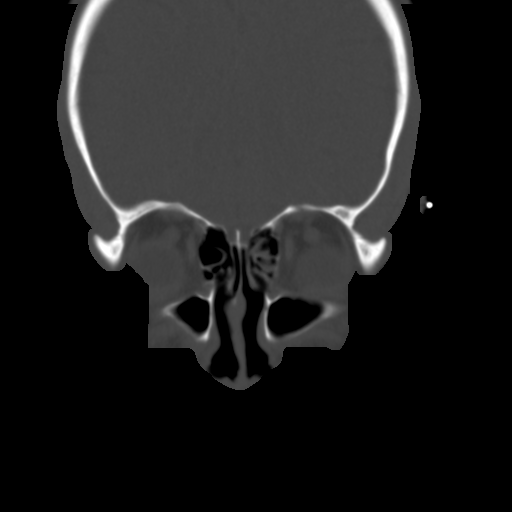
[im 8/13  bone]
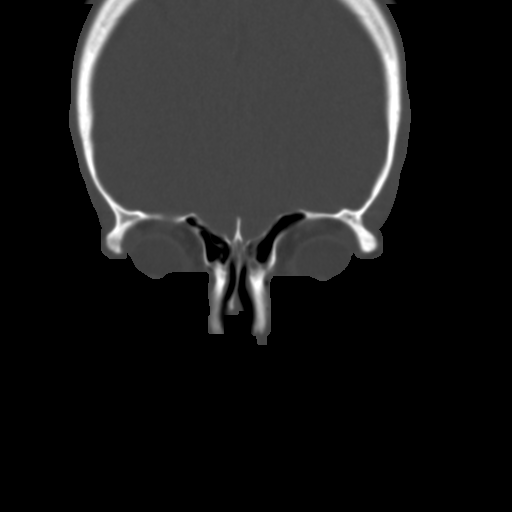
[im 9/13  bone]
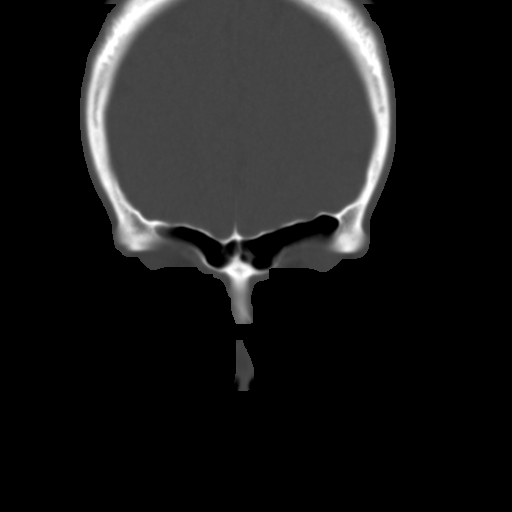
[im 10/13  soft-tissue]
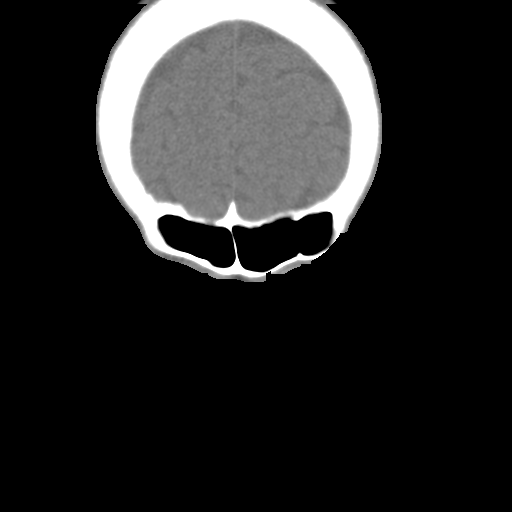
[im 10/13  bone]
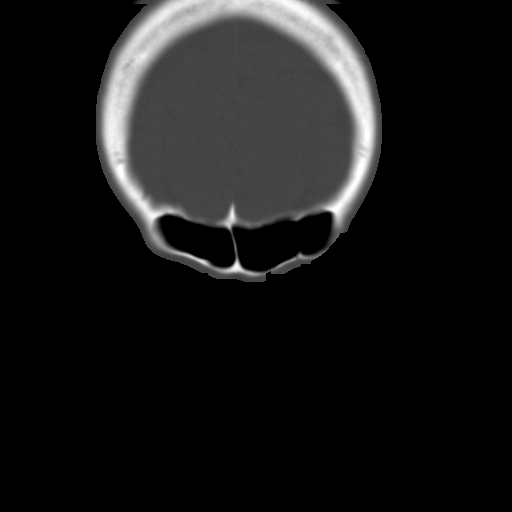
[im 11/13  bone]
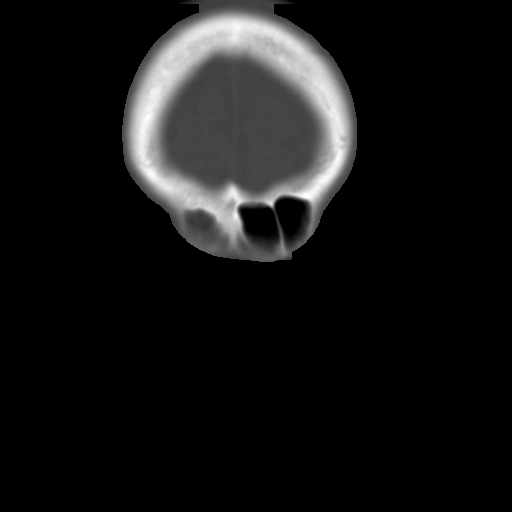
[im 12/13  bone]
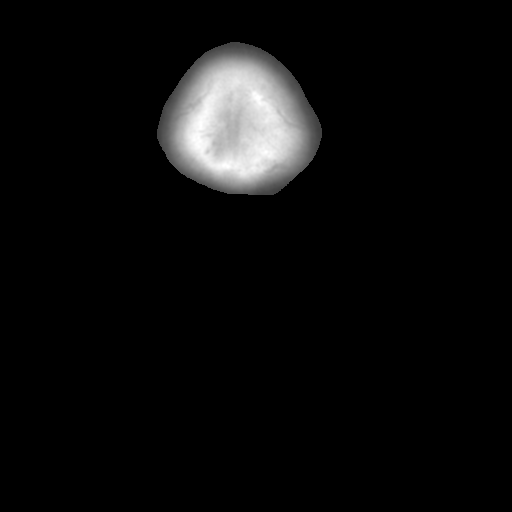

[11 of 14 positions shown; findings below may reference images not displayed]

FINDINGS: Osseous: No fracture or mandibular dislocation. No destructive
process.

Orbits: Negative. No traumatic or inflammatory finding.

Sinuses: Frontal sinuses are well aerated on limited views. Mild
scattered mucosal thickening in ethmoid air cells is relatively
mild. Mucosal thickening and debris is seen in the inferior right
maxillary sinus. The left maxillary sinus is clear. The ostiomeatal
complexes and frontal sinus drainage pathways are not well assessed
on this limited study. However, I believe the frontal sinus drainage
pathways are likely patent and there is likely narrowing of at least
the right ostiomeatal complex. No other bony abnormalities.

Soft tissues: Negative.

Limited intracranial: No significant or unexpected finding.
IMPRESSION: Sinus disease as above. No air-fluid levels to suggest acute
sinusitis.

## 2019-09-09 ENCOUNTER — Other Ambulatory Visit: Payer: Self-pay | Admitting: Internal Medicine

## 2019-09-09 DIAGNOSIS — Z720 Tobacco use: Secondary | ICD-10-CM
